# Patient Record
Sex: Female | Born: 1995 | Race: White | Marital: Single | Smoking: Never smoker
Health system: Southern US, Community
[De-identification: ages and names within clinical notes are randomized; demographics above are authoritative.]

## PROBLEM LIST (undated history)

## (undated) DIAGNOSIS — Z789 Other specified health status: Secondary | ICD-10-CM

## (undated) HISTORY — PX: TONSILLECTOMY: SUR1361

---

## 2015-12-03 ENCOUNTER — Inpatient Hospital Stay
Admission: EM | Admit: 2015-12-03 | Discharge: 2015-12-07 | DRG: 418 | Disposition: A | Discharge status: Home / Self Care | Payer: BLUE CROSS/BLUE SHIELD | Attending: Internal Medicine | Admitting: Internal Medicine

## 2015-12-03 ENCOUNTER — Emergency Department: Payer: BLUE CROSS/BLUE SHIELD

## 2015-12-03 ENCOUNTER — Encounter: Payer: Self-pay | Admitting: Emergency Medicine

## 2015-12-03 DIAGNOSIS — E876 Hypokalemia: Secondary | ICD-10-CM | POA: Diagnosis present

## 2015-12-03 DIAGNOSIS — K838 Other specified diseases of biliary tract: Secondary | ICD-10-CM

## 2015-12-03 DIAGNOSIS — K851 Biliary acute pancreatitis without necrosis or infection: Principal | ICD-10-CM | POA: Diagnosis present

## 2015-12-03 DIAGNOSIS — I959 Hypotension, unspecified: Secondary | ICD-10-CM | POA: Diagnosis present

## 2015-12-03 DIAGNOSIS — K8042 Calculus of bile duct with acute cholecystitis without obstruction: Secondary | ICD-10-CM | POA: Diagnosis not present

## 2015-12-03 DIAGNOSIS — R52 Pain, unspecified: Secondary | ICD-10-CM

## 2015-12-03 DIAGNOSIS — K8000 Calculus of gallbladder with acute cholecystitis without obstruction: Secondary | ICD-10-CM | POA: Diagnosis present

## 2015-12-03 DIAGNOSIS — K8020 Calculus of gallbladder without cholecystitis without obstruction: Secondary | ICD-10-CM

## 2015-12-03 DIAGNOSIS — R1013 Epigastric pain: Secondary | ICD-10-CM | POA: Diagnosis present

## 2015-12-03 DIAGNOSIS — R1011 Right upper quadrant pain: Secondary | ICD-10-CM

## 2015-12-03 HISTORY — DX: Other specified health status: Z78.9

## 2015-12-03 LAB — COMPREHENSIVE METABOLIC PANEL
ALBUMIN: 4.2 g/dL (ref 3.5–5.0)
ALK PHOS: 194 U/L — AB (ref 38–126)
ALT: 253 U/L — ABNORMAL HIGH (ref 14–54)
ANION GAP: 13 (ref 5–15)
AST: 280 U/L — ABNORMAL HIGH (ref 15–41)
BILIRUBIN TOTAL: 2.7 mg/dL — AB (ref 0.3–1.2)
BUN: 7 mg/dL (ref 6–20)
CALCIUM: 9.4 mg/dL (ref 8.9–10.3)
CO2: 19 mmol/L — ABNORMAL LOW (ref 22–32)
Chloride: 103 mmol/L (ref 101–111)
Creatinine, Ser: 0.9 mg/dL (ref 0.44–1.00)
GFR calc Af Amer: >60 mL/min (ref >60)
GLUCOSE: 107 mg/dL — AB (ref 65–99)
Potassium: 3.4 mmol/L — ABNORMAL LOW (ref 3.5–5.1)
Sodium: 135 mmol/L (ref 135–145)
TOTAL PROTEIN: 8.1 g/dL (ref 6.5–8.1)

## 2015-12-03 LAB — URINALYSIS COMPLETE WITH MICROSCOPIC (ARMC ONLY)
BILIRUBIN URINE: NEGATIVE
GLUCOSE, UA: NEGATIVE mg/dL
Hgb urine dipstick: NEGATIVE
LEUKOCYTES UA: NEGATIVE
NITRITE: NEGATIVE
Protein, ur: NEGATIVE mg/dL
SPECIFIC GRAVITY, URINE: 1.004 — AB (ref 1.005–1.030)
pH: 5 (ref 5.0–8.0)

## 2015-12-03 LAB — CBC
HCT: 45 % (ref 35.0–47.0)
Hemoglobin: 14.8 g/dL (ref 12.0–16.0)
MCH: 26 pg (ref 26.0–34.0)
MCHC: 32.9 g/dL (ref 32.0–36.0)
MCV: 79.1 fL — ABNORMAL LOW (ref 80.0–100.0)
Platelets: 294 10*3/uL (ref 150–440)
RBC: 5.7 MIL/uL — ABNORMAL HIGH (ref 3.80–5.20)
RDW: 14.3 % (ref 11.5–14.5)
WBC: 10.5 10*3/uL (ref 3.6–11.0)

## 2015-12-03 LAB — LIPASE, BLOOD: Lipase: 4182 U/L — ABNORMAL HIGH (ref 11–51)

## 2015-12-03 LAB — PREGNANCY, URINE: Preg Test, Ur: NEGATIVE

## 2015-12-03 MED ORDER — ONDANSETRON HCL 4 MG/2ML IJ SOLN
INTRAMUSCULAR | Status: AC
  Filled 2015-12-03: qty 2

## 2015-12-03 MED ORDER — MORPHINE SULFATE (PF) 4 MG/ML IV SOLN
INTRAVENOUS | Status: AC
  Filled 2015-12-03: qty 1

## 2015-12-03 MED ORDER — SODIUM CHLORIDE 0.9 % IV BOLUS (SEPSIS)
500.0000 mL | Freq: Once | INTRAVENOUS | Status: AC
Start: 2015-12-03 — End: 2015-12-03
  Administered 2015-12-03: 500 mL via INTRAVENOUS

## 2015-12-03 MED ORDER — MORPHINE SULFATE (PF) 4 MG/ML IV SOLN
4.0000 mg | Freq: Once | INTRAVENOUS | Status: AC
  Administered 2015-12-03: 4 mg via INTRAVENOUS

## 2015-12-03 MED ORDER — HYDROMORPHONE HCL 1 MG/ML IJ SOLN
1.0000 mg | Freq: Once | INTRAMUSCULAR | Status: AC
Start: 2015-12-03 — End: 2015-12-04
  Administered 2015-12-03: 1 mg via INTRAVENOUS

## 2015-12-03 MED ORDER — PIPERACILLIN-TAZOBACTAM 3.375 G IVPB 30 MIN
3.3750 g | Freq: Once | INTRAVENOUS | Status: AC
  Administered 2015-12-03: 3.375 g via INTRAVENOUS
  Filled 2015-12-03: qty 50

## 2015-12-03 MED ORDER — HYDROMORPHONE HCL 1 MG/ML IJ SOLN
INTRAMUSCULAR | Status: AC
  Filled 2015-12-03: qty 1

## 2015-12-03 MED ORDER — ONDANSETRON HCL 4 MG/2ML IJ SOLN
4.0000 mg | Freq: Once | INTRAMUSCULAR | Status: AC
  Administered 2015-12-03: 4 mg via INTRAVENOUS

## 2015-12-03 NOTE — H&P (Signed)
[facility] [place] at [facility]   PATIENT NAME: Sandra Archer    MR#:  [id number]  DATE OF BIRTH:  19-Mar-1995  DATE OF ADMISSION:  12/03/2015  PRIMARY CARE PHYSICIAN: Karena Addison, CNM   REQUESTING/REFERRING PHYSICIAN: Inocencio Homes, MD  CHIEF COMPLAINT:   Chief Complaint  Patient presents with  . Cholelithiasis    HISTORY OF PRESENT ILLNESS:  Sandra Archer  is a 20 y.o. female who presents with A couple of weeks of increasing abdominal discomfort. Initially was intermittent and the patient thought it is potentially due to acid reflux. She was taking some medication for the same. Then she went to the ED in [place] for evaluation and was found to have gallstones in her gallbladder, but was not found to be passing any stones at that time. She was scheduled for outpatient evaluation for elective surgery. However, over the past week her symptoms became more intense and more prolonged. Today she had a meal and had significant exacerbation of pain afterwards in the right upper quadrant and epigastric area. She came to the ED for evaluation and was found to have a stone in her common bile duct. Lipase also significantly elevated. Hospitalists were called for admission  PAST MEDICAL HISTORY:   Past Medical History:  Diagnosis Date  . Patient denies medical problems     PAST SURGICAL HISTORY:   Past Surgical History:  Procedure Laterality Date  . TONSILLECTOMY      SOCIAL HISTORY:   Social History  Substance Use Topics  . Smoking status: Never Smoker  . Smokeless tobacco: Never Used  . Alcohol use Yes    FAMILY HISTORY:   Family History  Problem Relation Age of Onset  . Gallstones Mother   . CAD Paternal Grandfather     DRUG ALLERGIES:   Allergies  Allergen Reactions  . Coconut Oil Swelling    MEDICATIONS AT HOME:   Prior to Admission medications   Not on File    REVIEW OF SYSTEMS:  Review of Systems  Constitutional: Negative  for chills, fever, malaise/fatigue and weight loss.  HENT: Negative for ear pain, hearing loss and tinnitus.   Eyes: Negative for blurred vision, double vision, pain and redness.  Respiratory: Negative for cough, hemoptysis and shortness of breath.   Cardiovascular: Negative for chest pain, palpitations, orthopnea and leg swelling.  Gastrointestinal: Positive for abdominal pain, nausea and vomiting. Negative for constipation and diarrhea.  Genitourinary: Negative for dysuria, frequency and hematuria.  Musculoskeletal: Negative for back pain, joint pain and neck pain.  Skin:       No acne, rash, or lesions  Neurological: Negative for dizziness, tremors, focal weakness and weakness.  Endo/Heme/Allergies: Negative for polydipsia. Does not bruise/bleed easily.  Psychiatric/Behavioral: Negative for depression. The patient is not nervous/anxious and does not have insomnia.      VITAL SIGNS:   Vitals:   12/03/15 1721 12/03/15 1722 12/03/15 2146  BP: 119/76  123/73  Pulse: 62  (!) 56  Resp: 18  18  Temp: 97.6 F (36.4 C)  98 F (36.7 C)  TempSrc: Oral  Oral  SpO2: 100%  98%  Weight:  117.9 kg (260 lb)   Height:  5\' 5"  (1.651 m)    Wt Readings from Last 3 Encounters:  12/03/15 117.9 kg (260 lb)    PHYSICAL EXAMINATION:  Physical Exam  Vitals reviewed. Constitutional: She is oriented to person, place, and time. She appears well-developed and well-nourished. No distress.  HENT:  Head: Normocephalic and atraumatic.  Mouth/Throat: Oropharynx is clear and moist.  Eyes: Conjunctivae and EOM are normal. Pupils are equal, round, and reactive to light. No scleral icterus.  Neck: Normal range of motion. Neck supple. No JVD present. No thyromegaly present.  Cardiovascular: Normal rate, regular rhythm and intact distal pulses.  Exam reveals no gallop and no friction rub.   No murmur heard. Respiratory: Effort normal and breath sounds normal. No respiratory distress. She has no wheezes. She  has no rales.  GI: Soft. Bowel sounds are normal. She exhibits no distension. There is tenderness (epigastric).  Musculoskeletal: Normal range of motion. She exhibits no edema.  No arthritis, no gout  Lymphadenopathy:    She has no cervical adenopathy.  Neurological: She is alert and oriented to person, place, and time. No cranial nerve deficit.  No dysarthria, no aphasia  Skin: Skin is warm and dry. No rash noted. No erythema.  Psychiatric: She has a normal mood and affect. Her behavior is normal. Judgment and thought content normal.    LABORATORY PANEL:   CBC  Recent Labs Lab 12/03/15 1730  WBC 10.5  HGB 14.8  HCT 45.0  PLT 294   ------------------------------------------------------------------------------------------------------------------  Chemistries   Recent Labs Lab 12/03/15 1730  NA 135  K 3.4*  CL 103  CO2 19*  GLUCOSE 107*  BUN 7  CREATININE 0.90  CALCIUM 9.4  AST 280*  ALT 253*  ALKPHOS 194*  BILITOT 2.7*   ------------------------------------------------------------------------------------------------------------------  Cardiac Enzymes No results for input(s): TROPONINI in the last 168 hours. ------------------------------------------------------------------------------------------------------------------  RADIOLOGY:  [place] Abdomen Limited Ruq  Result Date: 12/03/2015 CLINICAL DATA:  Initial evaluation for acute right upper quadrant and epigastric pain for 4 hours. Elevated LFTs with elevated lipase. EXAM: US ABDOMEN LIMITED - RIGHT UPPER QUADRANT COMPARISON:  None available. FINDINGS: Gallbladder: Multiple shadowing echogenic stones present within the gallbladder lumen, largest of which measured approximately 4.7 mm. Associated gallbladder sludge present. Gallbladder wall was thickened up to 7 mm. No frank pericholecystic free fluid. Unable to assess for sonographic Eulah PontMurphy sign as the patient was medicated. Common bile duct: Diameter: 10 mm. Possible  shadowing echogenic stone measuring approximately 6 mm noted within the distal common bile duct, suggestive of choledocholithiasis. Liver: No focal lesion identified. Within normal limits in parenchymal echogenicity. Mild intrahepatic biliary dilatation. IMPRESSION: 1. Cholelithiasis and sludge within the gallbladder lumen with associated gallbladder wall thickening. Given the provided history, findings are concerning for possible acute cholecystitis. 2. Dilatation of the common bile duct up to 10 mm. 6 mm echogenic focus within the distal common bile duct suggestive of an obstructive stone. Electronically Signed   By: Rise MuBenjamin  McClintock M.D.   On: 12/03/2015 20:48    EKG:  No orders found for this or any previous visit.  IMPRESSION AND PLAN:  Principal Problem:   Acute gallstone pancreatitis - when necessary pain meds and antiemetics tonight. We will get a GI consult and a surgery consult. Good possibility she'll need ERCP, though we will wait for GIs recommendations given her pancreatitis. Nothing by mouth for now with IV fluids.  All the records are reviewed and case discussed with ED provider. Management plans discussed with the patient and/or family.  DVT PROPHYLAXIS: SubQ lovenox  GI PROPHYLAXIS: None  ADMISSION STATUS: Inpatient  CODE STATUS: Full Code Status History    This patient does not have a recorded code status. Please follow your organizational policy for patients in this situation.      TOTAL TIME  TAKING CARE OF THIS PATIENT: 45 minutes.    Eamon Tantillo FIELDING 12/03/2015, 11:08 PM  [employer] [facility]  Office  [phone]  CC: Primary care physician; Karena Addison, CNM

## 2015-12-03 NOTE — ED Provider Notes (Signed)
[facility] Emergency Department Provider Note   ____________________________________________   First MD Initiated Contact with Patient 12/03/15 1732     (approximate)  I have reviewed the triage vital signs and the nursing notes.   HISTORY  Chief Complaint Cholelithiasis    HPI Sandra Archer is a 20 y.o. female with recent diagnosis of gallstones who presents for evaluation of 2 days of worsening epigastric and right upper quadrant pain which feels like "gallbladder attacks", gradual onset, constant, severe, no modifying factors. Patient was seen at an outside hospital ER in [place] approximately 2 weeks ago and diagnosed with gallstones, since then she has had pain related to the stones however pain became more severe yesterday. She was not discharged from the emergency department with any medications for pain or nausea. She has had nonbloody nonbilious emesis today. No fevers, no diarrhea. No chest pain or difficulty breathing. She is scheduled to establish care with Dr. Renda RollsWilton Smith on 12/13/2015.   History reviewed. No pertinent past medical history.  There are no active problems to display for this patient.   Past Surgical History:  Procedure Laterality Date  . TONSILLECTOMY      Prior to Admission medications   Not on File    Allergies Coconut oil  History reviewed. No pertinent family history.  Social History Social History  Substance Use Topics  . Smoking status: Never Smoker  . Smokeless tobacco: Never Used  . Alcohol use Yes    Review of Systems Constitutional: No fever/chills Eyes: No visual changes. ENT: No sore throat. Cardiovascular: Denies chest pain. Respiratory: Denies shortness of breath. Gastrointestinal: + abdominal pain.  + nausea, + vomiting.  No diarrhea.  No constipation. Genitourinary: Negative for dysuria. Musculoskeletal: Negative for back pain. Skin: Negative for rash. Neurological: Negative for  headaches, focal weakness or numbness.  10-point ROS otherwise negative.  ____________________________________________   PHYSICAL EXAM:  VITAL SIGNS: ED Triage Vitals  Enc Vitals Group     BP 12/03/15 1721 119/76     Pulse Rate 12/03/15 1721 62     Resp 12/03/15 1721 18     Temp 12/03/15 1721 97.6 F (36.4 C)     Temp Source 12/03/15 1721 Oral     SpO2 12/03/15 1721 100 %     Weight 12/03/15 1722 260 lb (117.9 kg)     Height 12/03/15 1722 5\' 5"  (1.651 m)     Head Circumference --      Peak Flow --      Pain Score 12/03/15 1723 8     Pain Loc --      Pain Edu? --      Excl. in GC? --     Constitutional: Alert and oriented. Well appearing and in no acute distress. Eyes: Conjunctivae are normal. PERRL. EOMI. Head: Atraumatic. Nose: No congestion/rhinnorhea. Mouth/Throat: Mucous membranes are moist.  Oropharynx non-erythematous. Neck: No stridor.  supple without meningismus. Cardiovascular: Normal rate, regular rhythm. Grossly normal heart sounds.  Good peripheral circulation. Respiratory: Normal respiratory effort.  No retractions. Lungs CTAB. Gastrointestinal: Soft with tenderness to palpation in the epigastrium and the right upper quadrant. No CVA tenderness. Genitourinary: deferred Musculoskeletal: No lower extremity tenderness nor edema.  No joint effusions. Neurologic:  Normal speech and language. No gross focal neurologic deficits are appreciated. No gait instability. Skin:  Skin is warm, dry and intact. No rash noted. Psychiatric: Mood and affect are normal. Speech and behavior are normal.  ____________________________________________   LABS (all labs  ordered are listed, but only abnormal results are displayed)  Labs Reviewed  LIPASE, BLOOD - Abnormal; Notable for the following:       Result Value   Lipase 4,182 (*)    All other components within normal limits  COMPREHENSIVE METABOLIC PANEL - Abnormal; Notable for the following:    Potassium 3.4 (*)    CO2  19 (*)    Glucose, Bld 107 (*)    AST 280 (*)    ALT 253 (*)    Alkaline Phosphatase 194 (*)    Total Bilirubin 2.7 (*)    All other components within normal limits  CBC - Abnormal; Notable for the following:    RBC 5.70 (*)    MCV 79.1 (*)    All other components within normal limits  URINALYSIS COMPLETEWITH MICROSCOPIC ([facility] ONLY) - Abnormal; Notable for the following:    Color, Urine YELLOW (*)    APPearance CLEAR (*)    Ketones, ur TRACE (*)    Specific Gravity, Urine 1.004 (*)    Bacteria, UA RARE (*)    Squamous Epithelial / LPF 0-5 (*)    All other components within normal limits  PREGNANCY, URINE   ____________________________________________  EKG  none ____________________________________________  RADIOLOGY  RUQ ultrasound IMPRESSION:  1. Cholelithiasis and sludge within the gallbladder lumen with  associated gallbladder wall thickening. Given the provided history,  findings are concerning for possible acute cholecystitis.  2. Dilatation of the common bile duct up to 10 mm. 6 mm echogenic  focus within the distal common bile duct suggestive of an  obstructive stone.    ____________________________________________   PROCEDURES  Procedure(s) performed: None  Procedures  Critical Care performed: No  ____________________________________________   INITIAL IMPRESSION / ASSESSMENT AND PLAN / ED COURSE  Pertinent labs & imaging results that were available during my care of the patient were reviewed by me and considered in my medical decision making (see chart for details).  Sandra Archer is a 20 y.o. female with recent diagnosis of gallstones who presents for evaluation of 2 days of worsening epigastric and right upper quadrant pain which feels like "gallbladder attacks". On exam, she is well-appearing and in no acute distress. Vital signs stable and she is afebrile. She does have tenderness to palpation in the epigastrium and the right upper  quadrant. Plan for abdominal labs, urinalysis, urine pregnancy test as well as right upper quadrant ultrasound, we'll treat her with anti-emetics, pain control and IV fluids.  ----------------------------------------- 9:14 PM on 12/03/2015 ----------------------------------------- Patient with significant improvement of her pain. CBC generally unremarkable. Urinalysis is not consistent with infection. Negative urine pregnancy test. CMP shows elevations of the LFTs as well as T bili, lipase is significantly elevated at greater than 4000 and right upper quadrant ultrasound confirms dilation of the common bile duct at 10 mm and concern for distal common bile duct stone. Ultrasound also shows thickening of the gallbladder wall without associated pericholecystic fluid which may represent an early acute cholecystitis, I have ordered Zosyn. I discussed the case with Dr. Excell Seltzer of Gen. surgery who recommended hospitalist admission with GI consult. We do have GI coverage tomorrow. Case discussed with hospitalist for admission.   Clinical Course     ____________________________________________   FINAL CLINICAL IMPRESSION(S) / ED DIAGNOSES  Final diagnoses:  Right upper quadrant abdominal pain  Pain  Choledocholithiasis with acute cholecystitis  Acute gallstone pancreatitis      NEW MEDICATIONS STARTED DURING THIS VISIT:  New Prescriptions  No medications on file     Note:  This document was prepared using Dragon voice recognition software and may include unintentional dictation errors.    Gayla Doss, MD 12/03/15 2139

## 2015-12-03 NOTE — ED Triage Notes (Signed)
Pt presents to ED with mother c/o gallstone attacks. Pt presents with known gallstones ; pain has worsened the last few weeks.

## 2015-12-03 NOTE — ED Notes (Signed)
Patient transported to Ultrasound 

## 2015-12-04 DIAGNOSIS — K8042 Calculus of bile duct with acute cholecystitis without obstruction: Secondary | ICD-10-CM

## 2015-12-04 DIAGNOSIS — K851 Biliary acute pancreatitis without necrosis or infection: Principal | ICD-10-CM

## 2015-12-04 LAB — BASIC METABOLIC PANEL
ANION GAP: 8 (ref 5–15)
BUN: 8 mg/dL (ref 6–20)
CALCIUM: 8.7 mg/dL — AB (ref 8.9–10.3)
CO2: 21 mmol/L — AB (ref 22–32)
CREATININE: 0.91 mg/dL (ref 0.44–1.00)
Chloride: 108 mmol/L (ref 101–111)
GFR calc Af Amer: >60 mL/min (ref >60)
GLUCOSE: 81 mg/dL (ref 65–99)
Potassium: 3.9 mmol/L (ref 3.5–5.1)
Sodium: 137 mmol/L (ref 135–145)

## 2015-12-04 LAB — LIPASE, BLOOD
LIPASE: 2354 U/L — AB (ref 11–51)
LIPASE: 1975 U/L — AB (ref 11–51)

## 2015-12-04 LAB — CBC
HCT: 40.7 % (ref 35.0–47.0)
Hemoglobin: 13.3 g/dL (ref 12.0–16.0)
MCH: 25.8 pg — AB (ref 26.0–34.0)
MCHC: 32.8 g/dL (ref 32.0–36.0)
MCV: 78.8 fL — ABNORMAL LOW (ref 80.0–100.0)
PLATELETS: 248 10*3/uL (ref 150–440)
RBC: 5.16 MIL/uL (ref 3.80–5.20)
RDW: 14.5 % (ref 11.5–14.5)
WBC: 8.8 10*3/uL (ref 3.6–11.0)

## 2015-12-04 MED ORDER — SODIUM CHLORIDE 0.9 % IV SOLN
INTRAVENOUS | Status: DC
  Administered 2015-12-04 – 2015-12-07 (×10): via INTRAVENOUS

## 2015-12-04 MED ORDER — HYDROMORPHONE HCL 1 MG/ML IJ SOLN
0.5000 mg | INTRAMUSCULAR | Status: DC | PRN
  Administered 2015-12-07: 0.5 mg via INTRAVENOUS
  Filled 2015-12-04: qty 1

## 2015-12-04 MED ORDER — OXYCODONE HCL 5 MG PO TABS: 5.0000 mg | ORAL_TABLET | ORAL | Status: DC | PRN

## 2015-12-04 MED ORDER — ONDANSETRON HCL 4 MG/2ML IJ SOLN: 4.0000 mg | Freq: Four times a day (QID) | INTRAMUSCULAR | Status: DC | PRN

## 2015-12-04 MED ORDER — ENOXAPARIN SODIUM 40 MG/0.4ML ~~LOC~~ SOLN
40.0000 mg | Freq: Two times a day (BID) | SUBCUTANEOUS | Status: DC
  Administered 2015-12-04 – 2015-12-06 (×5): 40 mg via SUBCUTANEOUS
  Filled 2015-12-04 (×5): qty 0.4

## 2015-12-04 MED ORDER — ONDANSETRON HCL 4 MG PO TABS: 4.0000 mg | ORAL_TABLET | Freq: Four times a day (QID) | ORAL | Status: DC | PRN

## 2015-12-04 NOTE — Consult Note (Signed)
Midge Minium, MD [facility]  [address] [place], [place] [address] Phone: [phone] Fax : [phone]  Consultation  Referring Provider:     No ref. provider found Primary Care Physician:  Karena Addison, CNM Primary Gastroenterologist:  None         Reason for Consultation:     Gallstone pancreatitis  Date of Admission:  12/03/2015 Date of Consultation:  12/04/2015         HPI:   Sandra Archer is a 20 y.o. female Who reports a couple of weeks of  Abdominal pain.  The patient  Was found to have gallstones in her gallbladder in the emergency department in [place].  She was discharged home and  Told to follow up as an outpatient.  The patient was then admitted with significant exacerbation of her pain in the right upper quadrant and epigastric area.  The patient underwent an ultrasound and was found to have sludge in the gallbladder and a dilated common bile duct with a stone in the distal common bile duct.  The patient was also found to have an increased lipase that has come down but not  Near-normal yet.   The patient reports that her abdominal pain is better than when she was admitted.  I'm now being asked to see the patient for gallstone pancreatitis with a retained stone and for possible ERCP.  Past Medical History:  Diagnosis Date  . Patient denies medical problems     Past Surgical History:  Procedure Laterality Date  . TONSILLECTOMY      Prior to Admission medications   Not on File    Family History  Problem Relation Age of Onset  . Gallstones Mother   . CAD Paternal Grandfather      Social History  Substance Use Topics  . Smoking status: Never Smoker  . Smokeless tobacco: Never Used  . Alcohol use Yes    Allergies as of 12/03/2015 - Review Complete 12/03/2015  Allergen Reaction Noted  . Coconut oil Swelling 12/03/2015    Review of Systems:    All systems reviewed and negative except where noted in HPI.   Physical Exam:  Vital signs in  last 24 hours: Temp:  [97.7 F (36.5 C)-98.2 F (36.8 C)] 98 F (36.7 C) (10/09 2007) Pulse Rate:  [56-78] 78 (10/09 2007) Resp:  [18-22] 18 (10/09 2007) BP: (108-123)/(66-87) 122/69 (10/09 2007) SpO2:  [98 %-100 %] 100 % (10/09 2007) Weight:  [244 lb 6.4 oz (110.9 kg)] 244 lb 6.4 oz (110.9 kg) (10/09 0000) Last BM Date: 12/03/15 General:   Pleasant, cooperative in NAD Head:  Normocephalic and atraumatic. Eyes:   No icterus.   Conjunctiva pink. PERRLA. Ears:  Normal auditory acuity. Neck:  Supple; no masses or thyroidomegaly Lungs: Respirations even and unlabored. Lungs clear to auscultation bilaterally.   No wheezes, crackles, or rhonchi.  Heart:  Regular rate and rhythm;  Without murmur, clicks, rubs or gallops Abdomen:  Soft, nondistended, Epigastric tenderness. Normal bowel sounds. No appreciable masses or hepatomegaly.  No rebound or guarding.  Rectal:  Not performed. Msk:  Symmetrical without gross deformities.    Extremities:  Without edema, cyanosis or clubbing. Neurologic:  Alert and oriented x3;  grossly normal neurologically. Skin:  Intact without significant lesions or rashes. Cervical Nodes:  No significant cervical adenopathy. Psych:  Alert and cooperative. Normal affect.  LAB RESULTS:  Recent Labs  12/03/15 1730 12/04/15 0552  WBC 10.5 8.8  HGB 14.8 13.3  HCT 45.0 40.7  PLT 294 248   BMET  Recent Labs  12/03/15 1730 12/04/15 0552  NA 135 137  K 3.4* 3.9  CL 103 108  CO2 19* 21*  GLUCOSE 107* 81  BUN 7 8  CREATININE 0.90 0.91  CALCIUM 9.4 8.7*   LFT  Recent Labs  12/03/15 1730  PROT 8.1  ALBUMIN 4.2  AST 280*  ALT 253*  ALKPHOS 194*  BILITOT 2.7*   PT/INR No results for input(s): LABPROT, INR in the last 72 hours.  STUDIES: [place] Abdomen Limited Ruq  Result Date: 12/03/2015 CLINICAL DATA:  Initial evaluation for acute right upper quadrant and epigastric pain for 4 hours. Elevated LFTs with elevated lipase. EXAM: US ABDOMEN LIMITED -  RIGHT UPPER QUADRANT COMPARISON:  None available. FINDINGS: Gallbladder: Multiple shadowing echogenic stones present within the gallbladder lumen, largest of which measured approximately 4.7 mm. Associated gallbladder sludge present. Gallbladder wall was thickened up to 7 mm. No frank pericholecystic free fluid. Unable to assess for sonographic Eulah PontMurphy sign as the patient was medicated. Common bile duct: Diameter: 10 mm. Possible shadowing echogenic stone measuring approximately 6 mm noted within the distal common bile duct, suggestive of choledocholithiasis. Liver: No focal lesion identified. Within normal limits in parenchymal echogenicity. Mild intrahepatic biliary dilatation. IMPRESSION: 1. Cholelithiasis and sludge within the gallbladder lumen with associated gallbladder wall thickening. Given the provided history, findings are concerning for possible acute cholecystitis. 2. Dilatation of the common bile duct up to 10 mm. 6 mm echogenic focus within the distal common bile duct suggestive of an obstructive stone. Electronically Signed   By: Rise MuBenjamin  McClintock M.D.   On: 12/03/2015 20:48      Impression / Plan:   Sandra Archer is a 20 y.o. y/o female with Gallstone pancreatitis and a retained stone in the common bile duct.  The patient's lipase has come down but not as rapidly as would be expected.  The patient will be kept nothing by mouth and she will undergo an ERCP tomorrow morning.  The patient and her mother have been explained the plan and the risks including perforation, bleeding worsening of pancreatitis and death.  The patient and her mother agree to proceed with the procedure.  Thank you for involving me in the care of this patient.      LOS: 1 day   Midge Miniumarren Avantika Shere, MD  12/04/2015, 9:08 PM   Note: This dictation was prepared with Dragon dictation along with smaller phrase technology. Any transcriptional errors that result from this process are unintentional.

## 2015-12-04 NOTE — Progress Notes (Signed)
[facility] - [place] at [facility]   PATIENT NAME: Sandra Archer    MR#:  [id number]  DATE OF BIRTH:  06/17/1995  SUBJECTIVE:  CHIEF COMPLAINT:   Chief Complaint  Patient presents with  . Cholelithiasis  She is still in pain but controlled with pain medication, parents at bedside REVIEW OF SYSTEMS:  Review of Systems  Constitutional: Negative for chills, fever and weight loss.  HENT: Negative for nosebleeds and sore throat.   Eyes: Negative for blurred vision.  Respiratory: Negative for cough, shortness of breath and wheezing.   Cardiovascular: Negative for chest pain, orthopnea, leg swelling and PND.  Gastrointestinal: Positive for abdominal pain and nausea. Negative for constipation, diarrhea, heartburn and vomiting.  Genitourinary: Negative for dysuria and urgency.  Musculoskeletal: Negative for back pain.  Skin: Negative for rash.  Neurological: Negative for dizziness, speech change, focal weakness and headaches.  Endo/Heme/Allergies: Does not bruise/bleed easily.  Psychiatric/Behavioral: Negative for depression.   DRUG ALLERGIES:   Allergies  Allergen Reactions  . Coconut Oil Swelling   VITALS:  Blood pressure 108/73, pulse 73, temperature 98.2 F (36.8 C), temperature source Oral, resp. rate 20, height 5\' 5"  (1.651 m), weight 110.9 kg (244 lb 6.4 oz), last menstrual period 11/12/2015, SpO2 98 %. PHYSICAL EXAMINATION:  Physical Exam  Constitutional: She is oriented to person, place, and time and well-developed, well-nourished, and in no distress.  HENT:  Head: Normocephalic and atraumatic.  Eyes: Conjunctivae and EOM are normal. Pupils are equal, round, and reactive to light.  Neck: Normal range of motion. Neck supple. No tracheal deviation present. No thyromegaly present.  Cardiovascular: Normal rate, regular rhythm and normal heart sounds.   Pulmonary/Chest: Effort normal and breath sounds normal. No respiratory distress. She has no wheezes. She  exhibits no tenderness.  Abdominal: Soft. Bowel sounds are normal. She exhibits no distension.  Musculoskeletal: Normal range of motion.  Neurological: She is alert and oriented to person, place, and time. No cranial nerve deficit.  Skin: Skin is warm and dry. No rash noted.  Psychiatric: Mood and affect normal.   LABORATORY PANEL:   CBC  Recent Labs Lab 12/04/15 0552  WBC 8.8  HGB 13.3  HCT 40.7  PLT 248   ------------------------------------------------------------------------------------------------------------------ Chemistries   Recent Labs Lab 12/03/15 1730 12/04/15 0552  NA 135 137  K 3.4* 3.9  CL 103 108  CO2 19* 21*  GLUCOSE 107* 81  BUN 7 8  CREATININE 0.90 0.91  CALCIUM 9.4 8.7*  AST 280*  --   ALT 253*  --   ALKPHOS 194*  --   BILITOT 2.7*  --    RADIOLOGY:  [place] Abdomen Limited Ruq  Result Date: 12/03/2015 CLINICAL DATA:  Initial evaluation for acute right upper quadrant and epigastric pain for 4 hours. Elevated LFTs with elevated lipase. EXAM: US ABDOMEN LIMITED - RIGHT UPPER QUADRANT COMPARISON:  None available. FINDINGS: Gallbladder: Multiple shadowing echogenic stones present within the gallbladder lumen, largest of which measured approximately 4.7 mm. Associated gallbladder sludge present. Gallbladder wall was thickened up to 7 mm. No frank pericholecystic free fluid. Unable to assess for sonographic Eulah PontMurphy sign as the patient was medicated. Common bile duct: Diameter: 10 mm. Possible shadowing echogenic stone measuring approximately 6 mm noted within the distal common bile duct, suggestive of choledocholithiasis. Liver: No focal lesion identified. Within normal limits in parenchymal echogenicity. Mild intrahepatic biliary dilatation. IMPRESSION: 1. Cholelithiasis and sludge within the gallbladder lumen with associated gallbladder wall thickening. Given the provided  history, findings are concerning for possible acute cholecystitis. 2. Dilatation of the common  bile duct up to 10 mm. 6 mm echogenic focus within the distal common bile duct suggestive of an obstructive stone. Electronically Signed   By: Rise Mu M.D.   On: 12/03/2015 20:48   ASSESSMENT AND PLAN:  20 year old female being admitted for acute gall stone pancreatitis  * Acute gallstone pancreatitis   - Continue when necessary pain meds and antiemetics  - Pending GI and a surgery consult. Good possibility she'll need ERCP, though we will await for GIs recommendations given her pancreatitis.  - Nothing by mouth for now with IV fluids.  * Acute cholecystitis with cholelithiasis - We will likely need cholecystectomy later  * Hypokalemia - Repleted and resolved  * Hypotension - Monitor while on IV fluids    All the records are reviewed and case discussed with Care Management/Social Worker. Management plans discussed with the patient, nursing family (parents at bedside) and they are in agreement.  CODE STATUS: FULL CODE  TOTAL TIME TAKING CARE OF THIS PATIENT: 35 minutes.   More than 50% of the time was spent in counseling/coordination of care: YES  POSSIBLE D/C IN 1-2 DAYS, DEPENDING ON CLINICAL CONDITION.  And GI - surgical evaluation   [facility], Reshard Guillet M.D on 12/04/2015 at 1:15 PM  Between 7am to 6pm - Pager - [phone]  After 6pm go to [email or web address] - Social research officer, government  [facility] [place] [facility]  Office  [phone]  CC: Primary care physician; Karena Addison, CNM  Note: This dictation was prepared with Dragon dictation along with smaller phrase technology. Any transcriptional errors that result from this process are unintentional.

## 2015-12-04 NOTE — Consult Note (Signed)
Patient ID: Sandra MayhewMartha K Loadholt, female   DOB: 06/08/1995, 20 y.o.   MRN: [id number]  HPI Sandra Archer is a 20 y.o. female seen in consultation for gallstone pancreatitis. She reports that she has been having symptoms for the last 4-5 months with intermittent right upper quadrant sharp and pulsating pain. Pain was moderate in intensity and was exacerbated by heavy meals. About 2 weeks ago and [facility] and in the emergency room and workup revealed evidence of gallstones without any other major abnormalities. Since then her symptoms have progressively worsened. Over the last week she has severe right upper quadrant and epigastric pain that is pulsating and I worsening with movements. At the [facility] but the narcotics she has been administered well she is in hospital. And she also reports some decreased appetite and nausea. No evidence of biliary obstruction. No evidence of jaundice, no fevers, no chills. He has good cardiovascular reserve and is able to perform more than 4 Mets without any shortness of breath or chest pain. No significant abdominal surgical history. She only had a history of tonsillectomy in the past. I have personally seen and review all the imaging studies. Ultrasound shows evidence of thickening gallbladder wall with a dilated common bile duct To 10 mm with a common bile duct stone. There is also evidence of stone within the gallbladder. I have also reviewed her laboratory data with an initial lipase of greater than 4000 and also increase in alkaline phosphatase and total bilirubin 2.8. Her [place] now [place] [address].  HPI  Past Medical History:  Diagnosis Date  . Patient denies medical problems     Past Surgical History:  Procedure Laterality Date  . TONSILLECTOMY      Family History  Problem Relation Age of Onset  . Gallstones Mother   . CAD Paternal Grandfather     Social History Social History   Substance Use Topics  . Smoking status: Never Smoker  . Smokeless tobacco: Never Used  . Alcohol use Yes    Allergies  Allergen Reactions  . Coconut Oil Swelling    Current Facility-Administered Medications  Medication Dose Route Frequency Provider Last Rate Last Dose  . 0.9 %  sodium chloride infusion   Intravenous Continuous Oralia Manisavid Willis, MD 100 mL/hr at 12/04/15 1720    . enoxaparin (LOVENOX) injection 40 mg  40 mg Subcutaneous BID Oralia Manisavid Willis, MD   40 mg at 12/04/15 0942  . HYDROmorphone (DILAUDID) injection 0.5 mg  0.5 mg Intravenous Q4H PRN Oralia Manisavid Willis, MD      . ondansetron [facility](ZOFRAN) tablet 4 mg  4 mg Oral Q6H PRN Oralia Manisavid Willis, MD       Or  . ondansetron [facility](ZOFRAN) injection 4 mg  4 mg Intravenous Q6H PRN Oralia Manisavid Willis, MD      . oxyCODONE (Oxy IR/ROXICODONE) immediate release tablet 5 mg  5 mg Oral Q4H PRN Oralia Manisavid Willis, MD         Review of Systems A 10 point review of systems was asked and was negative except for the information on the HPI  Physical Exam Blood pressure 122/69, pulse 78, temperature 98 F (36.7 C), temperature source Oral, resp. rate 18, height 5\' 5"  (1.651 m), weight 110.9 kg (244 lb 6.4 oz), last menstrual period 11/12/2015, SpO2 100 %. CONSTITUTIONAL: NAD, overweight EYES: Pupils are equal, round, and reactive to light, Sclera are non-icteric. EARS, NOSE, MOUTH AND THROAT: The  oropharynx is clear. The oral mucosa is pink and moist. Hearing is intact to voice. LYMPH NODES:  Lymph nodes in the neck are normal. RESPIRATORY:  Lungs are clear. There is normal respiratory effort, with equal breath sounds bilaterally, and without pathologic use of accessory muscles. CARDIOVASCULAR: Heart is regular without murmurs, gallops, or rubs. GI: The abdomen is soft, mildly tender to palpation in the right upper quadrant and epigastric area. No peritonitis no evidence of Murphy sign. GU: Rectal deferred.   MUSCULOSKELETAL: Normal muscle strength and tone. No cyanosis  or edema.   SKIN: Turgor is good and there are no pathologic skin lesions or ulcers. NEUROLOGIC: Motor and sensation is grossly normal. Cranial nerves are grossly intact. PSYCH:  Oriented to person, place and time. Affect is normal.  Data Reviewed  I have personally reviewed the patient's imaging, laboratory findings and medical records.    Assessment/Plan Evidence of gallstone pancreatitis and biliary obstruction is evidence on ultrasound with biliary dilation and dilated common bile duct stone with increase in total bilirubin. I have discussed in great detail with Dr.Wohl will perform an ERCP with a sphincterotomy in the morning. I will like to wait at least 24 hours to make sure that she recovers from her ERCP before proceeding with a laparoscopic cholecystectomy. We tentatively should be able to do her gallbladder on Thursday. Discussed with the patient in detail about her disease process.I discussed the procedure in detail.  The patient was given Agricultural engineer.  We discussed the risks and benefits of a laparoscopic cholecystectomy and possible cholangiogram including, but not limited to bleeding, infection, injury to surrounding structures such as the intestine or liver, bile leak, retained gallstones, need to convert to an open procedure, prolonged diarrhea, blood clots such as  DVT, common bile duct injury, anesthesia risks, and possible need for additional procedures.  The likelihood of improvement in symptoms and return to the patient's normal status is good. We discussed the typical post-operative recovery course. Dr. Tonita Cong will be likely performing the surgery and I will make sure that I will discuss the case in detail with him. All the questions were answered   Sterling Big, MD FACS General Surgeon 12/04/2015, 8:11 PM

## 2015-12-05 ENCOUNTER — Inpatient Hospital Stay: Payer: BLUE CROSS/BLUE SHIELD | Admitting: Anesthesiology

## 2015-12-05 ENCOUNTER — Encounter
Admission: EM | Disposition: A | Discharge status: Home / Self Care | Payer: Self-pay | Source: Home / Self Care | Attending: Internal Medicine

## 2015-12-05 DIAGNOSIS — K838 Other specified diseases of biliary tract: Secondary | ICD-10-CM

## 2015-12-05 HISTORY — PX: ENDOSCOPIC RETROGRADE CHOLANGIOPANCREATOGRAPHY (ERCP) WITH PROPOFOL: SHX5810

## 2015-12-05 LAB — COMPREHENSIVE METABOLIC PANEL
ALBUMIN: 3.5 g/dL (ref 3.5–5.0)
ALT: 158 U/L — ABNORMAL HIGH (ref 14–54)
AST: 102 U/L — AB (ref 15–41)
Alkaline Phosphatase: 140 U/L — ABNORMAL HIGH (ref 38–126)
Anion gap: 8 (ref 5–15)
BUN: 8 mg/dL (ref 6–20)
CHLORIDE: 109 mmol/L (ref 101–111)
CO2: 20 mmol/L — ABNORMAL LOW (ref 22–32)
Calcium: 8.6 mg/dL — ABNORMAL LOW (ref 8.9–10.3)
Creatinine, Ser: 0.83 mg/dL (ref 0.44–1.00)
GFR calc Af Amer: >60 mL/min (ref >60)
GFR calc non Af Amer: >60 mL/min (ref >60)
GLUCOSE: 75 mg/dL (ref 65–99)
POTASSIUM: 4.1 mmol/L (ref 3.5–5.1)
SODIUM: 137 mmol/L (ref 135–145)
Total Bilirubin: 0.8 mg/dL (ref 0.3–1.2)
Total Protein: 6.8 g/dL (ref 6.5–8.1)

## 2015-12-05 LAB — LIPASE, BLOOD
LIPASE: 1423 U/L — AB (ref 11–51)
LIPASE: 656 U/L — AB (ref 11–51)

## 2015-12-05 SURGERY — ENDOSCOPIC RETROGRADE CHOLANGIOPANCREATOGRAPHY (ERCP) WITH PROPOFOL
Anesthesia: General

## 2015-12-05 MED ORDER — LIDOCAINE HCL (CARDIAC) 20 MG/ML IV SOLN
INTRAVENOUS | Status: DC | PRN
  Administered 2015-12-05: 100 mg via INTRAVENOUS

## 2015-12-05 MED ORDER — MIDAZOLAM HCL 2 MG/2ML IJ SOLN
INTRAMUSCULAR | Status: DC | PRN
  Administered 2015-12-05: 2 mg via INTRAVENOUS

## 2015-12-05 MED ORDER — INDOMETHACIN 50 MG RE SUPP
100.0000 mg | Freq: Once | RECTAL | Status: AC
  Administered 2015-12-05: 100 mg via RECTAL
  Filled 2015-12-05: qty 2

## 2015-12-05 MED ORDER — FENTANYL CITRATE (PF) 100 MCG/2ML IJ SOLN
INTRAMUSCULAR | Status: DC | PRN
  Administered 2015-12-05: 100 ug via INTRAVENOUS

## 2015-12-05 MED ORDER — PROPOFOL 10 MG/ML IV BOLUS
INTRAVENOUS | Status: DC | PRN
  Administered 2015-12-05: 200 mg via INTRAVENOUS

## 2015-12-05 MED ORDER — ALFENTANIL 500 MCG/ML IJ INJ
INJECTION | INTRAMUSCULAR | Status: DC | PRN
  Administered 2015-12-05: 500 ug via INTRAVENOUS

## 2015-12-05 MED ORDER — ROCURONIUM BROMIDE 100 MG/10ML IV SOLN
INTRAVENOUS | Status: DC | PRN
  Administered 2015-12-05: 35 mg via INTRAVENOUS

## 2015-12-05 MED ORDER — ONDANSETRON HCL 4 MG/2ML IJ SOLN
INTRAMUSCULAR | Status: DC | PRN
  Administered 2015-12-05: 4 mg via INTRAVENOUS

## 2015-12-05 NOTE — Anesthesia Procedure Notes (Signed)
Procedure Name: Intubation Date/Time: 12/05/2015 1:32 PM Performed by: Shirlee LimerickMARION, Sandra Terpstra Pre-anesthesia Checklist: Patient identified, Emergency Drugs available, Suction available, Timeout performed and Patient being monitored Patient Re-evaluated:Patient Re-evaluated prior to inductionOxygen Delivery Method: Circle system utilized Preoxygenation: Pre-oxygenation with 100% oxygen Intubation Type: IV induction Laryngoscope Size: Mac and 3 Grade View: Grade I Tube size: 7.0 mm Number of attempts: 1 Placement Confirmation: ETT inserted through vocal cords under direct vision,  positive ETCO2 and breath sounds checked- equal and bilateral Secured at: 21 cm Tube secured with: Tape

## 2015-12-05 NOTE — Anesthesia Preprocedure Evaluation (Signed)
Anesthesia Evaluation  Patient identified by MRN, date of birth, ID band Patient awake    Reviewed: Allergy & Precautions, H&P , NPO status , Patient's Chart, lab work & pertinent test results, reviewed documented beta blocker date and time   Airway Mallampati: II   Neck ROM: full    Dental  (+) Teeth Intact   Pulmonary neg pulmonary ROS,    Pulmonary exam normal        Cardiovascular negative cardio ROS Normal cardiovascular exam Rhythm:regular Rate:Normal     Neuro/Psych negative neurological ROS  negative psych ROS   GI/Hepatic negative GI ROS, Neg liver ROS,   Endo/Other  negative endocrine ROS  Renal/GU negative Renal ROS  negative genitourinary   Musculoskeletal   Abdominal   Peds  Hematology negative hematology ROS (+)   Anesthesia Other Findings Past Medical History: No date: Patient denies medical problems Past Surgical History: No date: TONSILLECTOMY BMI    Body Mass Index:  40.67 kg/m     Reproductive/Obstetrics                             Anesthesia Physical Anesthesia Plan  ASA: II  Anesthesia Plan: General   Post-op Pain Management:    Induction: Intravenous  Airway Management Planned: Oral ETT  Additional Equipment:   Intra-op Plan:   Post-operative Plan:   Informed Consent: I have reviewed the patients History and Physical, chart, labs and discussed the procedure including the risks, benefits and alternatives for the proposed anesthesia with the patient or authorized representative who has indicated his/her understanding and acceptance.   Dental Advisory Given  Plan Discussed with: CRNA  Anesthesia Plan Comments:         Anesthesia Quick Evaluation

## 2015-12-05 NOTE — OR Nursing (Signed)
Patient extubated by anesthesia and taken to PACU for recovery.

## 2015-12-05 NOTE — Transfer of Care (Signed)
Immediate Anesthesia Transfer of Care Note  Patient: Sandra Archer  Procedure(s) Performed: Procedure(s): ENDOSCOPIC RETROGRADE CHOLANGIOPANCREATOGRAPHY (ERCP) WITH PROPOFOL (N/A)  Patient Location: PACU  Anesthesia Type:General  Level of Consciousness: awake, alert , oriented and patient cooperative  Airway & Oxygen Therapy: Patient Spontanous Breathing and Patient connected to nasal cannula oxygen  Post-op Assessment: Report given to RN and Post -op Vital signs reviewed and stable  Post vital signs: Reviewed and stable  Last Vitals:  Vitals:   12/05/15 0545 12/05/15 1159  BP: 114/73 132/76  Pulse: 90 79  Resp: 20 17  Temp: 36.6 C 37.1 C    Last Pain:  Vitals:   12/05/15 1159  TempSrc: Oral  PainSc:       Patients Stated Pain Goal: 0 (12/04/15 0000)  Complications: No apparent anesthesia complications

## 2015-12-05 NOTE — Progress Notes (Signed)
[facility] - [place] [facility]   PATIENT NAME: Sandra Archer    MR#:  [id number]  DATE OF BIRTH:  09/27/1995  SUBJECTIVE:  CHIEF COMPLAINT:   Chief Complaint  Patient presents with  . Cholelithiasis  S/p ERCP, pain under much better control REVIEW OF SYSTEMS:  Review of Systems  Constitutional: Negative for chills, fever and weight loss.  HENT: Negative for nosebleeds and sore throat.   Eyes: Negative for blurred vision.  Respiratory: Negative for cough, shortness of breath and wheezing.   Cardiovascular: Negative for chest pain, orthopnea, leg swelling and PND.  Gastrointestinal: Positive for abdominal pain and nausea. Negative for constipation, diarrhea, heartburn and vomiting.  Genitourinary: Negative for dysuria and urgency.  Musculoskeletal: Negative for back pain.  Skin: Negative for rash.  Neurological: Negative for dizziness, speech change, focal weakness and headaches.  Endo/Heme/Allergies: Does not bruise/bleed easily.  Psychiatric/Behavioral: Negative for depression.   DRUG ALLERGIES:   Allergies  Allergen Reactions  . Coconut Oil Swelling   VITALS:  Blood pressure 116/74, pulse 73, temperature 98 F (36.7 C), temperature source Oral, resp. rate 14, height 5\' 5"  (1.651 m), weight 110.9 kg (244 lb 6.4 oz), last menstrual period 11/12/2015, SpO2 100 %. PHYSICAL EXAMINATION:  Physical Exam  Constitutional: She is oriented to person, place, and time and well-developed, well-nourished, and in no distress.  HENT:  Head: Normocephalic and atraumatic.  Eyes: Conjunctivae and EOM are normal. Pupils are equal, round, and reactive to light.  Neck: Normal range of motion. Neck supple. No tracheal deviation present. No thyromegaly present.  Cardiovascular: Normal rate, regular rhythm and normal heart sounds.   Pulmonary/Chest: Effort normal and breath sounds normal. No respiratory distress. She has no wheezes. She exhibits no tenderness.  Abdominal:  Soft. Bowel sounds are normal. She exhibits no distension.  Musculoskeletal: Normal range of motion.  Neurological: She is alert and oriented to person, place, and time. No cranial nerve deficit.  Skin: Skin is warm and dry. No rash noted.  Psychiatric: Mood and affect normal.   LABORATORY PANEL:   CBC  Recent Labs Lab 12/04/15 0552  WBC 8.8  HGB 13.3  HCT 40.7  PLT 248   ------------------------------------------------------------------------------------------------------------------ Chemistries   Recent Labs Lab 12/05/15 0513  NA 137  K 4.1  CL 109  CO2 20*  GLUCOSE 75  BUN 8  CREATININE 0.83  CALCIUM 8.6*  AST 102*  ALT 158*  ALKPHOS 140*  BILITOT 0.8   RADIOLOGY:  No results found. ASSESSMENT AND PLAN:  20 year old female being admitted for acute gall stone pancreatitis  * Acute gallstone pancreatitis   - Continue when necessary pain meds and antiemetics  - s/p ERCP and biliary sphincterotomy - Biliary sludge was found.  * Acute cholecystitis with cholelithiasis - Lap cholecystectomy planned for Thursday  * Hypokalemia - Repleted and resolved  * Hypotension - Monitor while on IV fluids    All the records are reviewed and case discussed with Care Management/Social Worker. Management plans discussed with the patient, nursing family (parents at bedside) and they are in agreement.  CODE STATUS: FULL CODE  TOTAL TIME TAKING CARE OF THIS PATIENT: 35 minutes.   More than 50% of the time was spent in counseling/coordination of care: YES  POSSIBLE D/C IN 2-3 DAYS, DEPENDING ON CLINICAL CONDITION.  surgical evaluation, lap chole planned for thursday   [facility], Chany Woolworth M.D on 12/05/2015 at 4:18 PM  Between 7am to 6pm - Pager - [phone]  After 6pm  go to [email or web address] - Social research officer, government  [facility] [place] [facility]  Office  [phone]  CC: Primary care physician; Karena Addison, CNM  Note: This dictation was prepared with Dragon  dictation along with smaller phrase technology. Any transcriptional errors that result from this process are unintentional.

## 2015-12-05 NOTE — Anesthesia Postprocedure Evaluation (Signed)
Anesthesia Post Note  Patient: Sandra Archer  Procedure(s) Performed: Procedure(s) (LRB): ENDOSCOPIC RETROGRADE CHOLANGIOPANCREATOGRAPHY (ERCP) WITH PROPOFOL (N/A)  Patient location during evaluation: PACU Anesthesia Type: General Level of consciousness: awake and alert and oriented Pain management: pain level controlled Vital Signs Assessment: post-procedure vital signs reviewed and stable Respiratory status: spontaneous breathing Cardiovascular status: blood pressure returned to baseline Anesthetic complications: no    Last Vitals:  Vitals:   12/05/15 1440 12/05/15 1453  BP: 123/78 118/75  Pulse: 60 63  Resp: (!) 23 (!) 23  Temp:      Last Pain:  Vitals:   12/05/15 1159  TempSrc: Oral  PainSc:                  Sandra Archer

## 2015-12-05 NOTE — Op Note (Addendum)
[facility] Patient Name: Sandra Archer Procedure Date: 12/05/2015 1:18 PM MRN: [id number] Account #: [id number] Date of Birth: 02-16-1996 Admit Type: Outpatient Age: 20 Room: [facility] ENDO ROOM 4 Gender: Female Note Status: Finalized Procedure:            ERCP Indications:          Suspected bile duct stone(s) Providers:            Midge Minium MD, MD Referring MD:         No Local Md, MD (Referring MD) Medicines:            General Anesthesia Complications:        No immediate complications. Procedure:            Pre-Anesthesia Assessment:                       - Prior to the procedure, a History and Physical was                        performed, and patient medications and allergies were                        reviewed. The patient's tolerance of previous                        anesthesia was also reviewed. The risks and benefits of                        the procedure and the sedation options and risks were                        discussed with the patient. All questions were                        answered, and informed consent was obtained. Prior                        Anticoagulants: The patient has taken no previous                        anticoagulant or antiplatelet agents. ASA Grade                        Assessment: II - A patient with mild systemic disease.                        After reviewing the risks and benefits, the patient was                        deemed in satisfactory condition to undergo the                        procedure.                       After obtaining informed consent, the scope was passed                        under direct vision. Throughout the procedure, the  patient's blood pressure, pulse, and oxygen saturations                        were monitored continuously. The Endosonoscope was                        introduced through the mouth, and used to inject                        contrast  into and used to inject contrast into the bile                        duct. The ERCP was accomplished without difficulty. The                        patient tolerated the procedure well. Findings:      The scout film was normal. The esophagus was successfully intubated       under direct vision. The scope was advanced to a normal major papilla in       the descending duodenum without detailed examination of the pharynx,       larynx and associated structures, and upper GI tract. The upper GI tract       was grossly normal. The bile duct was deeply cannulated with the       short-nosed traction sphincterotome. Contrast was injected. I personally       interpreted the bile duct images. There was brisk flow of contrast       through the ducts. Image quality was excellent. Contrast extended to the       hepatic ducts. The main bile duct was diffusely dilated. A wire was       passed into the biliary tree. A 6 mm biliary sphincterotomy was made       with a traction (standard) sphincterotome using ERBE electrocautery.       There was no post-sphincterotomy bleeding. The biliary tree was swept       with a 15 mm balloon starting at the bifurcation. Sludge was swept from       the duct. Impression:           - The entire main bile duct was dilated.                       - A biliary sphincterotomy was performed.                       - The biliary tree was swept and sludge was found. Recommendation:       - Watch for pancreatitis, bleeding, perforation, and                        cholangitis. Procedure Code(s):    --- Professional ---                       [phone], Endoscopic retrograde cholangiopancreatography                        (ERCP); with removal of calculi/debris from                        biliary/pancreatic duct(s)  16109, Endoscopic retrograde cholangiopancreatography                        (ERCP); with sphincterotomy/papillotomy                       [phone],  Endoscopic catheterization of the biliary ductal                        system, radiological supervision and interpretation Diagnosis Code(s):    --- Professional ---                       K83.8, Other specified diseases of biliary tract CPT copyright 2016 American Medical Association. All rights reserved. The codes documented in this report are preliminary and upon coder review may  be revised to meet current compliance requirements. Midge Minium MD, MD 12/05/2015 2:03:42 PM This report has been signed electronically. Number of Addenda: 0 Note Initiated On: 12/05/2015 1:18 PM      [facility]

## 2015-12-05 NOTE — OR Nursing (Signed)
1.36 patient intubated by anasthesia.

## 2015-12-05 NOTE — Progress Notes (Signed)
CC: GS panreatitis  Subjective: Significant improvement over the last 24 hrs. Underwent ERCP w sphinterotomy, sludge in the CBD. She reports no abdominal pain. Tolerating clears, no n/v. No fevers Lipase trending down as well as wbc  Objective: Vital signs in last 24 hours: Temp:  [97.9 F (36.6 C)-98.7 F (37.1 C)] 98 F (36.7 C) (10/10 1541) Pulse Rate:  [60-90] 73 (10/10 1541) Resp:  [14-26] 14 (10/10 1541) BP: (112-132)/(69-100) 116/74 (10/10 1541) SpO2:  [99 %-100 %] 100 % (10/10 1541) Last BM Date: 12/03/15  Intake/Output from previous day: 10/09 0701 - 10/10 0700 In: 2950 [I.V.:2950] Out: 350 [Urine:350] Intake/Output this shift: No intake/output data recorded.  Physical exam: NAD awake alert Abd: soft, NT, no peritonitis Ext: well perfused, no edema. Neuro: no focal motor or sensory deficits. Competent.  Lab Results: CBC   Recent Labs  12/03/15 1730 12/04/15 0552  WBC 10.5 8.8  HGB 14.8 13.3  HCT 45.0 40.7  PLT 294 248   BMET  Recent Labs  12/04/15 0552 12/05/15 0513  NA 137 137  K 3.9 4.1  CL 108 109  CO2 21* 20*  GLUCOSE 81 75  BUN 8 8  CREATININE 0.91 0.83  CALCIUM 8.7* 8.6*   PT/INR No results for input(s): LABPROT, INR in the last 72 hours. ABG No results for input(s): PHART, HCO3 in the last 72 hours.  Invalid input(s): PCO2, PO2  Studies/Results: Koreas Abdomen Limited Ruq  Result Date: 12/03/2015 CLINICAL DATA:  Initial evaluation for acute right upper quadrant and epigastric pain for 4 hours. Elevated LFTs with elevated lipase. EXAM: US ABDOMEN LIMITED - RIGHT UPPER QUADRANT COMPARISON:  None available. FINDINGS: Gallbladder: Multiple shadowing echogenic stones present within the gallbladder lumen, largest of which measured approximately 4.7 mm. Associated gallbladder sludge present. Gallbladder wall was thickened up to 7 mm. No frank pericholecystic free fluid. Unable to assess for sonographic Eulah PontMurphy sign as the patient was medicated.  Common bile duct: Diameter: 10 mm. Possible shadowing echogenic stone measuring approximately 6 mm noted within the distal common bile duct, suggestive of choledocholithiasis. Liver: No focal lesion identified. Within normal limits in parenchymal echogenicity. Mild intrahepatic biliary dilatation. IMPRESSION: 1. Cholelithiasis and sludge within the gallbladder lumen with associated gallbladder wall thickening. Given the provided history, findings are concerning for possible acute cholecystitis. 2. Dilatation of the common bile duct up to 10 mm. 6 mm echogenic focus within the distal common bile duct suggestive of an obstructive stone. Electronically Signed   By: Rise MuBenjamin  McClintock M.D.   On: 12/03/2015 20:48    Anti-infectives: Anti-infectives    Start     Dose/Rate Route Frequency Ordered Stop   12/03/15 2115  piperacillin-tazobactam (ZOSYN) IVPB 3.375 g     3.375 g 100 mL/hr over 30 Minutes Intravenous  Once 12/03/15 2113 12/03/15 2239      Assessment/Plan: GS pancreatitis improving with medical rx. We will schedule lap chole for Thursday for Dr. Tonita CongWoodham. Procedure d/w the pt and family in detail. They understand and wish to proceed.  Sandra Bigiego Davaughn Hillyard, MD, [place] [facility]  12/05/2015

## 2015-12-06 ENCOUNTER — Encounter: Payer: Self-pay | Admitting: Gastroenterology

## 2015-12-06 LAB — COMPREHENSIVE METABOLIC PANEL
ALBUMIN: 3.4 g/dL — AB (ref 3.5–5.0)
ALT: 99 U/L — ABNORMAL HIGH (ref 14–54)
ANION GAP: 8 (ref 5–15)
AST: 45 U/L — ABNORMAL HIGH (ref 15–41)
Alkaline Phosphatase: 107 U/L (ref 38–126)
BUN: 6 mg/dL (ref 6–20)
CHLORIDE: 108 mmol/L (ref 101–111)
CO2: 20 mmol/L — AB (ref 22–32)
Calcium: 8.7 mg/dL — ABNORMAL LOW (ref 8.9–10.3)
Creatinine, Ser: 0.71 mg/dL (ref 0.44–1.00)
GFR calc non Af Amer: >60 mL/min (ref >60)
GLUCOSE: 104 mg/dL — AB (ref 65–99)
Potassium: 3.3 mmol/L — ABNORMAL LOW (ref 3.5–5.1)
SODIUM: 136 mmol/L (ref 135–145)
Total Bilirubin: 0.4 mg/dL (ref 0.3–1.2)
Total Protein: 6.7 g/dL (ref 6.5–8.1)

## 2015-12-06 LAB — CBC
HEMATOCRIT: 37.4 % (ref 35.0–47.0)
HEMOGLOBIN: 12.6 g/dL (ref 12.0–16.0)
MCH: 26.4 pg (ref 26.0–34.0)
MCHC: 33.6 g/dL (ref 32.0–36.0)
MCV: 78.5 fL — ABNORMAL LOW (ref 80.0–100.0)
Platelets: 207 10*3/uL (ref 150–440)
RBC: 4.76 MIL/uL (ref 3.80–5.20)
RDW: 14.2 % (ref 11.5–14.5)
WBC: 6.4 10*3/uL (ref 3.6–11.0)

## 2015-12-06 LAB — LIPASE, BLOOD
LIPASE: 277 U/L — AB (ref 11–51)
LIPASE: 291 U/L — AB (ref 11–51)

## 2015-12-06 LAB — SURGICAL PCR SCREEN
MRSA, PCR: NEGATIVE
STAPHYLOCOCCUS AUREUS: NEGATIVE

## 2015-12-06 MED ORDER — POTASSIUM CHLORIDE CRYS ER 20 MEQ PO TBCR
40.0000 meq | EXTENDED_RELEASE_TABLET | Freq: Once | ORAL | Status: AC
  Administered 2015-12-06: 40 meq via ORAL
  Filled 2015-12-06: qty 2

## 2015-12-06 MED ORDER — ACETAMINOPHEN 325 MG PO TABS: 650.0000 mg | ORAL_TABLET | Freq: Four times a day (QID) | ORAL | Status: DC | PRN

## 2015-12-06 NOTE — Progress Notes (Signed)
[employer] [facility] - [place] [facility]   PATIENT NAME: Sandra AlmasMartha Archer    MR#:  [id number]  DATE OF BIRTH:  08/10/1995  SUBJECTIVE:  CHIEF COMPLAINT:   Chief Complaint  Patient presents with  . Cholelithiasis   much better, laparoscopic cholecystectomy tomorrow REVIEW OF SYSTEMS:  Review of Systems  Constitutional: Negative for chills, fever and weight loss.  HENT: Negative for nosebleeds and sore throat.   Eyes: Negative for blurred vision.  Respiratory: Negative for cough, shortness of breath and wheezing.   Cardiovascular: Negative for chest pain, orthopnea, leg swelling and PND.  Gastrointestinal: Positive for abdominal pain and nausea. Negative for constipation, diarrhea, heartburn and vomiting.  Genitourinary: Negative for dysuria and urgency.  Musculoskeletal: Negative for back pain.  Skin: Negative for rash.  Neurological: Negative for dizziness, speech change, focal weakness and headaches.  Endo/Heme/Allergies: Does not bruise/bleed easily.  Psychiatric/Behavioral: Negative for depression.   DRUG ALLERGIES:   Allergies  Allergen Reactions  . Coconut Oil Swelling   VITALS:  Blood pressure (!) 112/59, pulse (!) 57, temperature 98.4 F (36.9 C), temperature source Oral, resp. rate 17, height 5\' 5"  (1.651 m), weight 110.9 kg (244 lb 6.4 oz), last menstrual period 11/12/2015, SpO2 98 %. PHYSICAL EXAMINATION:  Physical Exam  Constitutional: She is oriented to person, place, and time and well-developed, well-nourished, and in no distress.  HENT:  Head: Normocephalic and atraumatic.  Eyes: Conjunctivae and EOM are normal. Pupils are equal, round, and reactive to light.  Neck: Normal range of motion. Neck supple. No tracheal deviation present. No thyromegaly present.  Cardiovascular: Normal rate, regular rhythm and normal heart sounds.   Pulmonary/Chest: Effort normal and breath sounds normal. No respiratory distress. She has no wheezes. She exhibits no  tenderness.  Abdominal: Soft. Bowel sounds are normal. She exhibits no distension.  Musculoskeletal: Normal range of motion.  Neurological: She is alert and oriented to person, place, and time. No cranial nerve deficit.  Skin: Skin is warm and dry. No rash noted.  Psychiatric: Mood and affect normal.   LABORATORY PANEL:   CBC  Recent Labs Lab 12/06/15 0535  WBC 6.4  HGB 12.6  HCT 37.4  PLT 207   ------------------------------------------------------------------------------------------------------------------ Chemistries   Recent Labs Lab 12/05/15 0513  NA 137  K 4.1  CL 109  CO2 20*  GLUCOSE 75  BUN 8  CREATININE 0.83  CALCIUM 8.6*  AST 102*  ALT 158*  ALKPHOS 140*  BILITOT 0.8   RADIOLOGY:  No results found. ASSESSMENT AND PLAN:  20 year old female being admitted for acute gall stone pancreatitis  * Acute gallstone pancreatitis   - Lipase continues to improve, monitor - Continue when necessary pain meds and antiemetics  - s/p ERCP and biliary sphincterotomy on 10/10  * Acute cholecystitis with cholelithiasis - Lap cholecystectomy planned for Thursday per surgery  * Hypokalemia - Repleted and resolved  * Hypotension - Monitor while on IV fluids    All the records are reviewed and case discussed with Care Management/Social Worker. Management plans discussed with the patient, nursing, family (mother at bedside) and they are in agreement.  CODE STATUS: FULL CODE  TOTAL TIME TAKING CARE OF THIS PATIENT: 35 minutes.   More than 50% of the time was spent in counseling/coordination of care: YES  POSSIBLE D/C IN 1-2 DAYS, DEPENDING ON CLINICAL CONDITION.  surgical evaluation, lap chole planned for thursday   [facility], Marabella Popiel M.D on 12/06/2015 at 11:23 AM  Between 7am to 6pm -  Pager - [phone]  After 6pm go to [email or web address] - Social research officer, government  [facility]  Office  [phone]  CC: Primary care physician; Karena Addison, CNM  Note: This dictation was prepared with Dragon dictation along with smaller phrase technology. Any transcriptional errors that result from this process are unintentional.

## 2015-12-06 NOTE — Progress Notes (Signed)
Revisited with patient this evening. She's been tolerating a diet all day without any return of pain or nausea.  Discussed with the patient that she is now ready for a laparoscopic cholecystectomy. I discussed the procedure in detail.  We discussed the risks and benefits of a laparoscopic cholecystectomy and possible cholangiogram including, but not limited to bleeding, infection, injury to surrounding structures such as the intestine or liver, bile leak, retained gallstones, need to convert to an open procedure, prolonged diarrhea, blood clots such as  DVT, common bile duct injury, anesthesia risks, and possible need for additional procedures.  The likelihood of improvement in symptoms and return to the patient's normal status is good. We discussed the typical post-operative recovery course.  The patient voiced understanding and desired to proceed. Plan for surgery tomorrow morning.  Ricarda Frameharles Deniel Mcquiston, MD [facility] General Surgeon [facility]

## 2015-12-06 NOTE — Progress Notes (Signed)
CC: Abdominal pain Subjective: Patient reports that her abdominal pain has continued to improve since her ERCP. She has been tolerating a diet without nausea, vomiting, worsening pain.  Objective: Vital signs in last 24 hours: Temp:  [98 F (36.7 C)-98.7 F (37.1 C)] 98.4 F (36.9 C) (10/11 0528) Pulse Rate:  [57-79] 57 (10/11 0528) Resp:  [14-26] 17 (10/11 0528) BP: (112-132)/(59-100) 112/59 (10/11 0528) SpO2:  [97 %-100 %] 98 % (10/11 0528) Last BM Date: 12/04/15  Intake/Output from previous day: 10/10 0701 - 10/11 0700 In: 2168 [P.O.:240; I.V.:1928] Out: 2445 [Urine:2445] Intake/Output this shift: Total I/O In: 262 [I.V.:262] Out: 0   Physical exam:  Gen.: No acute distress Chest: Clear to auscultation Heart: Regular rhythm Abdomen: Soft, minimally tender to palpation in the midepigastric region, nondistended.  Lab Results: CBC   Recent Labs  12/04/15 0552 12/06/15 0535  WBC 8.8 6.4  HGB 13.3 12.6  HCT 40.7 37.4  PLT 248 207   BMET  Recent Labs  12/04/15 0552 12/05/15 0513  NA 137 137  K 3.9 4.1  CL 108 109  CO2 21* 20*  GLUCOSE 81 75  BUN 8 8  CREATININE 0.91 0.83  CALCIUM 8.7* 8.6*   PT/INR No results for input(s): LABPROT, INR in the last 72 hours. ABG No results for input(s): PHART, HCO3 in the last 72 hours.  Invalid input(s): PCO2, PO2  Studies/Results: No results found.  Anti-infectives: Anti-infectives    Start     Dose/Rate Route Frequency Ordered Stop   12/03/15 2115  piperacillin-tazobactam (ZOSYN) IVPB 3.375 g     3.375 g 100 mL/hr over 30 Minutes Intravenous  Once 12/03/15 2113 12/03/15 2239      Assessment/Plan:  20 year old female admitted with gallstone pancreatitis now status post ERCP. Her labs are trending towards normal but are not quite normal today. Discussed that I would return later today to fully go over the operative planning for tomorrow. Encourage ambulation, oral intake, incentive spirometer usage.  Tentatively planned for surgery tomorrow.  Reha Martinovich T. Tonita CongWoodham, MD, FACS  12/06/2015

## 2015-12-07 ENCOUNTER — Encounter
Admission: EM | Disposition: A | Discharge status: Home / Self Care | Payer: Self-pay | Source: Home / Self Care | Attending: Internal Medicine

## 2015-12-07 ENCOUNTER — Inpatient Hospital Stay: Payer: BLUE CROSS/BLUE SHIELD | Admitting: Certified Registered"

## 2015-12-07 ENCOUNTER — Encounter: Payer: Self-pay | Admitting: *Deleted

## 2015-12-07 ENCOUNTER — Inpatient Hospital Stay: Payer: BLUE CROSS/BLUE SHIELD

## 2015-12-07 HISTORY — PX: CHOLECYSTECTOMY: SHX55

## 2015-12-07 LAB — BASIC METABOLIC PANEL
ANION GAP: 5 (ref 5–15)
BUN: 6 mg/dL (ref 6–20)
CALCIUM: 8.6 mg/dL — AB (ref 8.9–10.3)
CHLORIDE: 113 mmol/L — AB (ref 101–111)
CO2: 23 mmol/L (ref 22–32)
Creatinine, Ser: 0.82 mg/dL (ref 0.44–1.00)
GFR calc non Af Amer: >60 mL/min (ref >60)
Glucose, Bld: 102 mg/dL — ABNORMAL HIGH (ref 65–99)
Potassium: 3.7 mmol/L (ref 3.5–5.1)
Sodium: 141 mmol/L (ref 135–145)

## 2015-12-07 LAB — LIPASE, BLOOD: Lipase: 304 U/L — ABNORMAL HIGH (ref 11–51)

## 2015-12-07 SURGERY — LAPAROSCOPIC CHOLECYSTECTOMY
Anesthesia: General | Wound class: Clean Contaminated

## 2015-12-07 MED ORDER — LIDOCAINE HCL (PF) 1 % IJ SOLN
INTRAMUSCULAR | Status: AC
  Filled 2015-12-07: qty 30

## 2015-12-07 MED ORDER — PROPOFOL 10 MG/ML IV BOLUS
INTRAVENOUS | Status: DC | PRN
  Administered 2015-12-07: 100 mg via INTRAVENOUS
  Administered 2015-12-07: 200 mg via INTRAVENOUS

## 2015-12-07 MED ORDER — LACTATED RINGERS IV SOLN
INTRAVENOUS | Status: DC | PRN
  Administered 2015-12-07: 12:00:00 via INTRAVENOUS

## 2015-12-07 MED ORDER — BUPIVACAINE HCL (PF) 0.5 % IJ SOLN
INTRAMUSCULAR | Status: AC
  Filled 2015-12-07: qty 30

## 2015-12-07 MED ORDER — LIDOCAINE HCL 1 % IJ SOLN
INTRAMUSCULAR | Status: DC | PRN
  Administered 2015-12-07: 18 mL via SUBCUTANEOUS

## 2015-12-07 MED ORDER — IOTHALAMATE MEGLUMINE 60 % INJ SOLN
INTRAMUSCULAR | Status: DC | PRN
  Administered 2015-12-07: 20 mL

## 2015-12-07 MED ORDER — SUGAMMADEX SODIUM 200 MG/2ML IV SOLN
INTRAVENOUS | Status: DC | PRN
  Administered 2015-12-07: 220 mg via INTRAVENOUS

## 2015-12-07 MED ORDER — LIDOCAINE HCL (CARDIAC) 20 MG/ML IV SOLN
INTRAVENOUS | Status: DC | PRN
  Administered 2015-12-07: 100 mg via INTRAVENOUS

## 2015-12-07 MED ORDER — DEXAMETHASONE SODIUM PHOSPHATE 10 MG/ML IJ SOLN
INTRAMUSCULAR | Status: DC | PRN
  Administered 2015-12-07: 10 mg via INTRAVENOUS

## 2015-12-07 MED ORDER — OXYCODONE-ACETAMINOPHEN 5-325 MG PO TABS: 1.0000 | ORAL_TABLET | ORAL | 0 refills | Status: DC | PRN

## 2015-12-07 MED ORDER — ONDANSETRON HCL 4 MG/2ML IJ SOLN
INTRAMUSCULAR | Status: DC | PRN
  Administered 2015-12-07: 4 mg via INTRAVENOUS

## 2015-12-07 MED ORDER — FENTANYL CITRATE (PF) 100 MCG/2ML IJ SOLN
INTRAMUSCULAR | Status: DC | PRN
  Administered 2015-12-07 (×4): 50 ug via INTRAVENOUS

## 2015-12-07 MED ORDER — FENTANYL CITRATE (PF) 100 MCG/2ML IJ SOLN
25.0000 ug | INTRAMUSCULAR | Status: AC | PRN
  Administered 2015-12-07 (×6): 25 ug via INTRAVENOUS

## 2015-12-07 MED ORDER — ROCURONIUM BROMIDE 100 MG/10ML IV SOLN
INTRAVENOUS | Status: DC | PRN
  Administered 2015-12-07 (×2): 20 mg via INTRAVENOUS
  Administered 2015-12-07: 30 mg via INTRAVENOUS

## 2015-12-07 MED ORDER — MIDAZOLAM HCL 2 MG/2ML IJ SOLN
INTRAMUSCULAR | Status: DC | PRN
  Administered 2015-12-07: 2 mg via INTRAVENOUS

## 2015-12-07 MED ORDER — FENTANYL CITRATE (PF) 100 MCG/2ML IJ SOLN
INTRAMUSCULAR | Status: AC
  Administered 2015-12-07: 25 ug via INTRAVENOUS
  Filled 2015-12-07: qty 2

## 2015-12-07 MED ORDER — ONDANSETRON HCL 4 MG/2ML IJ SOLN: 4.0000 mg | Freq: Once | INTRAMUSCULAR | Status: DC | PRN

## 2015-12-07 MED ORDER — CEFAZOLIN SODIUM 1 G IJ SOLR
INTRAMUSCULAR | Status: DC | PRN
Start: 2015-12-07 — End: 2015-12-07
  Administered 2015-12-07: 2 g via INTRAMUSCULAR

## 2015-12-07 SURGICAL SUPPLY — 43 items
ADHESIVE MASTISOL STRL (MISCELLANEOUS) ×3 IMPLANT
APPLIER CLIP ROT 10 11.4 M/L (STAPLE) ×3
BAG COUNTER SPONGE EZ (MISCELLANEOUS) IMPLANT
BLADE SURG SZ11 CARB STEEL (BLADE) ×3 IMPLANT
CANISTER SUCT 1200ML W/VALVE (MISCELLANEOUS) ×3 IMPLANT
CATH CHOLANG 76X19 KUMAR (CATHETERS) ×3 IMPLANT
CHLORAPREP W/TINT 26ML (MISCELLANEOUS) ×3 IMPLANT
CLIP APPLIE ROT 10 11.4 M/L (STAPLE) ×1 IMPLANT
CLOSURE WOUND 1/2 X4 (GAUZE/BANDAGES/DRESSINGS)
CONRAY 60ML FOR OR (MISCELLANEOUS) IMPLANT
COUNTER SPONGE BAG EZ (MISCELLANEOUS)
DRAPE SHEET LG 3/4 BI-LAMINATE (DRAPES) ×3 IMPLANT
DRESSING TELFA 4X3 1S ST N-ADH (GAUZE/BANDAGES/DRESSINGS) ×3 IMPLANT
DRSG TEGADERM 2-3/8X2-3/4 SM (GAUZE/BANDAGES/DRESSINGS) ×12 IMPLANT
ELECT REM PT RETURN 9FT ADLT (ELECTROSURGICAL) ×3
ELECTRODE REM PT RTRN 9FT ADLT (ELECTROSURGICAL) ×1 IMPLANT
GLOVE BIO SURGEON STRL SZ7.5 (GLOVE) ×3 IMPLANT
GLOVE INDICATOR 8.0 STRL GRN (GLOVE) ×3 IMPLANT
GOWN STRL REUS W/ TWL LRG LVL3 (GOWN DISPOSABLE) ×3 IMPLANT
GOWN STRL REUS W/TWL LRG LVL3 (GOWN DISPOSABLE) ×6
GRASPER SUT TROCAR 14GX15 (MISCELLANEOUS) ×3 IMPLANT
IRRIGATION STRYKERFLOW (MISCELLANEOUS) IMPLANT
IRRIGATOR STRYKERFLOW (MISCELLANEOUS)
IV NS 1000ML (IV SOLUTION)
IV NS 1000ML BAXH (IV SOLUTION) IMPLANT
L-HOOK LAP DISP 36CM (ELECTROSURGICAL) ×3
LABEL OR SOLS (LABEL) ×3 IMPLANT
LHOOK LAP DISP 36CM (ELECTROSURGICAL) ×1 IMPLANT
NEEDLE HYPO 25X1 1.5 SAFETY (NEEDLE) ×3 IMPLANT
NEEDLE VERESS 14GA 120MM (NEEDLE) ×3 IMPLANT
NS IRRIG 500ML POUR BTL (IV SOLUTION) ×3 IMPLANT
PACK LAP CHOLECYSTECTOMY (MISCELLANEOUS) ×3 IMPLANT
PENCIL ELECTRO HAND CTR (MISCELLANEOUS) ×3 IMPLANT
POUCH ENDO CATCH 10MM SPEC (MISCELLANEOUS) ×3 IMPLANT
SCISSORS METZENBAUM CVD 33 (INSTRUMENTS) ×3 IMPLANT
SLEEVE ENDOPATH XCEL 5M (ENDOMECHANICALS) ×6 IMPLANT
STRIP CLOSURE SKIN 1/2X4 (GAUZE/BANDAGES/DRESSINGS) IMPLANT
SUT MNCRL 4-0 (SUTURE) ×2
SUT MNCRL 4-0 27XMFL (SUTURE) ×1
SUTURE MNCRL 4-0 27XMF (SUTURE) ×1 IMPLANT
TROCAR XCEL 12X100 BLDLESS (ENDOMECHANICALS) ×3 IMPLANT
TROCAR XCEL NON-BLD 5MMX100MML (ENDOMECHANICALS) ×3 IMPLANT
TUBING INSUFFLATOR HI FLOW (MISCELLANEOUS) ×3 IMPLANT

## 2015-12-07 NOTE — Brief Op Note (Signed)
12/03/2015 - 12/07/2015  1:31 PM  PATIENT:  Sandra CoganMartha K Archer  20 y.o. female  PRE-OPERATIVE DIAGNOSIS:  Gallstone pancreatitis  POST-OPERATIVE DIAGNOSIS:  Same  PROCEDURE:  Procedure(s): LAPAROSCOPIC CHOLECYSTECTOMY (N/A)  SURGEON:  Surgeon(s) and Role:    * Ricarda Frameharles Alyssamarie Mounsey, MD - Primary  PHYSICIAN ASSISTANT: None  ASSISTANTS: none   ANESTHESIA:   general  EBL:  Total I/O In: 953.3 [I.V.:953.3] Out: 20 [Blood:20]  BLOOD ADMINISTERED:none  DRAINS: none   LOCAL MEDICATIONS USED:  MARCAINE   , XYLOCAINE  and Amount: 18 ml  SPECIMEN:  Source of Specimen:  Gallbladder  DISPOSITION OF SPECIMEN:  PATHOLOGY  COUNTS:  YES  TOURNIQUET:  * No tourniquets in log *  DICTATION: .Dragon Dictation  PLAN OF CARE: Return to inpatient status  PATIENT DISPOSITION:  PACU - hemodynamically stable.   Delay start of Pharmacological VTE agent (>24hrs) due to surgical blood loss or risk of bleeding: no

## 2015-12-07 NOTE — Progress Notes (Signed)
Pt d/c to home today. IV removed intact.  Rx's given to pt w/all questions and concerns addressed.  D/C paperwork reviewed and education provided with all questions and concerns addressed.  Pt family at bedside for home transport.   

## 2015-12-07 NOTE — Transfer of Care (Signed)
Immediate Anesthesia Transfer of Care Note  Patient: Sandra MayhewMartha K Cho  Procedure(s) Performed: Procedure(s): LAPAROSCOPIC CHOLECYSTECTOMY (N/A)  Patient Location: PACU  Anesthesia Type:General  Level of Consciousness: awake, alert , oriented and patient cooperative  Airway & Oxygen Therapy: Patient Spontanous Breathing and Patient connected to face mask oxygen  Post-op Assessment: Report given to RN, Post -op Vital signs reviewed and stable and Patient moving all extremities X 4  Post vital signs: Reviewed and stable  Last Vitals:  Vitals:   12/07/15 1041 12/07/15 1337  BP: 117/74   Pulse: 77   Resp: 18   Temp: (!) 35.6 C (P) 37.6 C    Last Pain:  Vitals:   12/07/15 1041  TempSrc: Tympanic  PainSc:       Patients Stated Pain Goal: 0 (12/04/15 0000)  Complications: No apparent anesthesia complications

## 2015-12-07 NOTE — Anesthesia Procedure Notes (Signed)
Procedure Name: Intubation Date/Time: 12/07/2015 12:22 PM Performed by: Michaele OfferSAVAGE, Murel Shenberger Pre-anesthesia Checklist: Patient identified, Emergency Drugs available, Suction available, Patient being monitored and Timeout performed Patient Re-evaluated:Patient Re-evaluated prior to inductionOxygen Delivery Method: Circle system utilized Preoxygenation: Pre-oxygenation with 100% oxygen Intubation Type: IV induction Ventilation: Mask ventilation without difficulty Laryngoscope Size: Mac and 3 Grade View: Grade I Tube type: Oral Tube size: 7.0 mm Number of attempts: 1 Airway Equipment and Method: Rigid stylet Placement Confirmation: ETT inserted through vocal cords under direct vision,  positive ETCO2 and breath sounds checked- equal and bilateral Secured at: 21 cm Tube secured with: Tape Dental Injury: Teeth and Oropharynx as per pre-operative assessment

## 2015-12-07 NOTE — OR Nursing (Signed)
Urine pregnancy from 12/03/2015 was negative. Dr. Darleene CleaverVan Staveren aware and he states no repeat testing for pregnancy needed today.

## 2015-12-07 NOTE — Discharge Instructions (Signed)
Laparoscopic Cholecystectomy, Care After Refer to this sheet in the next few weeks. These instructions provide you with information about caring for yourself after your procedure. Your health care provider may also give you more specific instructions. Your treatment has been planned according to current medical practices, but problems sometimes occur. Call your health care provider if you have any problems or questions after your procedure. WHAT TO EXPECT AFTER THE PROCEDURE After your procedure, it is common to have:  Pain at your incision sites. You will be given pain medicines to control your pain.  Mild nausea or vomiting. This should improve after the first 24 hours.  Bloating and possible shoulder pain from the gas that was used during the procedure. This will improve after the first 24 hours. HOME CARE INSTRUCTIONS Incision Care  Follow instructions from your health care provider about how to take care of your incisions. Make sure you:  Wash your hands with soap and water before you change your bandage (dressing). If soap and water are not available, use hand sanitizer.  Change your dressing as told by your health care provider.  Leave stitches (sutures), skin glue, or adhesive strips in place. These skin closures may need to be in place for 2 weeks or longer. If adhesive strip edges start to loosen and curl up, you may trim the loose edges. Do not remove adhesive strips completely unless your health care provider tells you to do that.  Do not take baths, swim, or use a hot tub until your health care provider approves. Ask your health care provider if you can take showers. (OK TO SHOWER IN 24 hours) You may only be allowed to take sponge baths for bathing. General Instructions  Take over-the-counter and prescription medicines only as told by your health care provider.  Do not drive or operate heavy machinery while taking prescription pain medicine.  Return to your normal diet as  told by your health care provider.  Do not lift anything that is heavier than 10 lb (4.5 kg).  Do not play contact sports for one week or until your health care provider approves. SEEK MEDICAL CARE IF:   You have redness, swelling, or pain at the site of your incision.  You have fluid, blood, or pus coming from your incision.  You notice a bad smell coming from your incision area.  Your surgical incisions break open.  You have a fever. SEEK IMMEDIATE MEDICAL CARE IF:  You develop a rash.  You have difficulty breathing.  You have chest pain.  You have increasing pain in your shoulders (shoulder strap areas).  You faint or have dizzy episodes while you are standing.  You have severe pain in your abdomen.  You have nausea or vomiting that lasts for more than one day.   This information is not intended to replace advice given to you by your health care provider. Make sure you discuss any questions you have with your health care provider.   Document Released: 02/11/2005 Document Revised: 11/02/2014 Document Reviewed: 09/23/2012 Elsevier Interactive Patient Education [employer]2016 [employer] Inc.

## 2015-12-07 NOTE — Op Note (Signed)
Laparoscopic Cholecystectomy  Pre-operative Diagnosis: Gallstone pancreatitis  Post-operative Diagnosis: Same  Procedure: Laparoscopic cholecystectomy with intraoperative cholangiogram  Surgeon: Leonette Most T. Tonita Cong, MD FACS  Anesthesia: Gen. with endotracheal tube  Assistant: None  Procedure Details  The patient was seen again in the Holding Room. The benefits, complications, treatment options, and expected outcomes were discussed with the patient. The risks of bleeding, infection, recurrence of symptoms, failure to resolve symptoms, bile duct damage, bile duct leak, retained common bile duct stone, bowel injury, any of which could require further surgery and/or ERCP, stent, or papillotomy were reviewed with the patient. The likelihood of improving the patient's symptoms with return to their baseline status is good.  The patient and/or family concurred with the proposed plan, giving informed consent.  The patient was taken to Operating Room, identified as CALIYA NARINE and the procedure verified as Laparoscopic Cholecystectomy.  A Time Out was held and the above information confirmed.  Prior to the induction of general anesthesia, antibiotic prophylaxis was administered. VTE prophylaxis was in place. General endotracheal anesthesia was then administered and tolerated well. After the induction, the abdomen was prepped with Chloraprep and draped in the sterile fashion. The patient was positioned in the supine position.  Local anesthetic  was injected into the skin near the umbilicus and an incision made. The Veress needle was placed. Pneumoperitoneum was then created with CO2 and tolerated well without any adverse changes in the patient's vital signs. A 5mm port was placed in the periumbilical position and the abdominal cavity was explored.  Two 5-mm ports were placed in the right upper quadrant and a 12 mm epigastric port was placed all under direct vision. All skin incisions  were infiltrated  with a local anesthetic agent before making the incision and placing the trocars.   The patient was positioned  in reverse Trendelenburg, tilted slightly to the patient's left.  The gallbladder was identified, the fundus grasped and retracted cephalad. There were evidence of inflammatory adhesions both medially and laterally to the gallbladder to the liver as well as a fluffy appearance of the porta hepatis consistent with prior pancreatitis. Adhesions were lysed bluntly. The infundibulum was grasped and retracted laterally, exposing the peritoneum overlying the triangle of Calot. This was then divided and exposed in a blunt fashion. A critical view of the cystic duct and cystic artery was obtained.  The cystic duct was clearly identified and bluntly dissected.   At this point a cholangiogram was performed using a Kumar clamp and catheter. The initial images showed a filling defect in the distal common bile duct. As additional contrast was injected this was able to be flushed out of the duct without any further intervention. A complete cholangiogram showing the cystic, common and right and left hepatic ducts emptying into the duodenum was able to then be seen without any evidence of further filling defect. The Kumar catheter and clamp were then removed under direct visualization. The previously identified duct and artery was serially clipped and cut with EndoShears. An additional posterior branch to the artery was identified and also clipped and cut.  The gallbladder was taken from the gallbladder fossa in a retrograde fashion with the electrocautery. The gallbladder was removed and placed in an Endocatch bag. The liver bed was irrigated and inspected. Hemostasis was achieved with the electrocautery. Copious irrigation was utilized and was repeatedly aspirated until clear.  The gallbladder and Endocatch sac were then removed through the epigastric port site.   Inspection of the right upper  quadrant was  performed. No bleeding, bile duct injury or leak, or bowel injury was noted. Pneumoperitoneum was released. 4-0 subcuticular Monocryl was used to close the skin. Steristrips and Mastisol and sterile dressings were  applied.  The patient was then extubated and brought to the recovery room in stable condition. Sponge, lap, and needle counts were correct at closure and at the conclusion of the case.   Findings: Inflammatory rind consistent with gallstone pancreatitis  Estimated Blood Loss: 10 mL         Drains: None         Specimens: Gallbladder           Complications: none               Jorge Retz T. Tonita CongWoodham, MD, FACS

## 2015-12-07 NOTE — Anesthesia Preprocedure Evaluation (Signed)
Anesthesia Evaluation  Patient identified by MRN, date of birth, ID band Patient awake    Reviewed: Allergy & Precautions, NPO status , Patient's Chart, lab work & pertinent test results  Airway Mallampati: II       Dental  (+) Teeth Intact   Pulmonary neg pulmonary ROS,    breath sounds clear to auscultation       Cardiovascular Exercise Tolerance: Good  Rhythm:Regular Rate:Normal     Neuro/Psych negative neurological ROS     GI/Hepatic negative GI ROS, Neg liver ROS,   Endo/Other  negative endocrine ROS  Renal/GU negative Renal ROS     Musculoskeletal   Abdominal Normal abdominal exam  (+)   Peds negative pediatric ROS (+)  Hematology negative hematology ROS (+)   Anesthesia Other Findings   Reproductive/Obstetrics                             Anesthesia Physical Anesthesia Plan  ASA: I  Anesthesia Plan: General   Post-op Pain Management:    Induction: Intravenous  Airway Management Planned: Natural Airway and Nasal Cannula  Additional Equipment:   Intra-op Plan:   Post-operative Plan: Extubation in OR  Informed Consent: I have reviewed the patients History and Physical, chart, labs and discussed the procedure including the risks, benefits and alternatives for the proposed anesthesia with the patient or authorized representative who has indicated his/her understanding and acceptance.     Plan Discussed with: CRNA  Anesthesia Plan Comments:         Anesthesia Quick Evaluation

## 2015-12-07 NOTE — Progress Notes (Signed)
[facility] [place] at [facility]   PATIENT NAME: Sandra Archer    MR#:  [id number]  DATE OF BIRTH:  12-22-1995  SUBJECTIVE:  CHIEF COMPLAINT:   Chief Complaint  Patient presents with  . Cholelithiasis     REVIEW OF SYSTEMS:  Review of Systems  Constitutional: Negative for chills, fever and weight loss.  HENT: Negative for nosebleeds and sore throat.   Eyes: Negative for blurred vision.  Respiratory: Negative for cough, shortness of breath and wheezing.   Cardiovascular: Negative for chest pain, orthopnea, leg swelling and PND.  Gastrointestinal: Negative for abdominal pain, constipation, diarrhea, heartburn, nausea and vomiting.  Genitourinary: Negative for dysuria and urgency.  Musculoskeletal: Negative for back pain.  Skin: Negative for rash.  Neurological: Negative for dizziness, speech change, focal weakness and headaches.  Endo/Heme/Allergies: Does not bruise/bleed easily.  Psychiatric/Behavioral: Negative for depression.   DRUG ALLERGIES:   Allergies  Allergen Reactions  . Coconut Oil Swelling   VITALS:  Blood pressure 117/74, pulse 77, temperature (!) 96.1 F (35.6 C), temperature source Tympanic, resp. rate 18, height 5\' 5"  (1.651 m), weight 244 lb 6.4 oz (110.9 kg), last menstrual period 11/12/2015, SpO2 99 %. PHYSICAL EXAMINATION:  Physical Exam  Constitutional: She is oriented to person, place, and time and well-developed, well-nourished, and in no distress.  HENT:  Head: Normocephalic and atraumatic.  Eyes: Conjunctivae and EOM are normal. Pupils are equal, round, and reactive to light.  Neck: Normal range of motion. Neck supple. No tracheal deviation present. No thyromegaly present.  Cardiovascular: Normal rate, regular rhythm and normal heart sounds.   Pulmonary/Chest: Effort normal and breath sounds normal. No respiratory distress. She has no wheezes. She exhibits no tenderness.  Abdominal: Soft. Bowel sounds are normal. She exhibits  no distension.  Musculoskeletal: Normal range of motion.  Neurological: She is alert and oriented to person, place, and time. No cranial nerve deficit.  Skin: Skin is warm and dry. No rash noted.  Psychiatric: Mood and affect normal.   LABORATORY PANEL:   CBC  Recent Labs Lab 12/06/15 0535  WBC 6.4  HGB 12.6  HCT 37.4  PLT 207   ------------------------------------------------------------------------------------------------------------------ Chemistries   Recent Labs Lab 12/06/15 1651 12/07/15 0440  NA 136 141  K 3.3* 3.7  CL 108 113*  CO2 20* 23  GLUCOSE 104* 102*  BUN 6 6  CREATININE 0.71 0.82  CALCIUM 8.7* 8.6*  AST 45*  --   ALT 99*  --   ALKPHOS 107  --   BILITOT 0.4  --    RADIOLOGY:  No results found. ASSESSMENT AND PLAN:  20 year old female being admitted for acute gall stone pancreatitis  * Acute gallstone pancreatitis   - For lap cholecystectomy later today - s/p ERCP and biliary sphincterotomy on 10/10  * Acute cholecystitis with cholelithiasis - Laparoscopic cholecystectomy today  * Hypokalemia - Repleted and resolved  * Hypotension - Resolved  All the records are reviewed and case discussed with Care Management/Social Worker. Management plans discussed with the patient, nursing, family (mother at bedside) and they are in agreement.  CODE STATUS: FULL CODE  TOTAL TIME TAKING CARE OF THIS PATIENT: 32 minutes.   More than 50% of the time was spent in counseling/coordination of care: YES  POSSIBLE D/C IN 1-2 DAYS, DEPENDING ON CLINICAL CONDITION.  surgical evaluation, lap chole planned for thursday   Auburn Bilberry M.D on 12/07/2015 at 11:08 AM  Between 7am to 6pm - Pager - [phone]  After 6pm go to [email or web address] - Social research officer, governmentpassword [email or web address] [facility]  [facility] [place] [facility]  Office  [phone]  CC: Primary care physician; Karena AddisonSigmon, Meredith C, CNM  Note: This dictation was prepared with Dragon dictation along with smaller  phrase technology. Any transcriptional errors that result from this process are unintentional.

## 2015-12-07 NOTE — Discharge Summary (Signed)
Patient ID: Sandra MayhewMartha K Vitullo MRN: [id number] DOB/AGE: 21/08/1995 21 y.o.  Admit date: 12/03/2015 Discharge date: 12/07/2015  Discharge Diagnoses:  Gallstone Pancreatitis  Procedures Performed: ERCP and Laparoscopic Cholecystectomy  Discharged Condition: good  Hospital Course: Patient admitted with gallstone pancreatitis. Underwent an ERCP and had resolution of her pancreatitis prior to taking her to the operating room for a laparoscopic cholecystectomy. She tolerated the procedure well and was able to be discharged home the afternoon following the surgery.  Discharge Orders: Discharge home, OK to shower in 24 hours. Do not submerge. Replace dressings daily until there is no further drainage.  Disposition: HOME  Discharge Medications:   Medication List    TAKE these medications   oxyCODONE-acetaminophen 5-325 MG tablet Commonly known as:  ROXICET Take 1-2 tablets by mouth every 4 (four) hours as needed.        Follwup: Follow-up Information    Ricarda Frameharles Eriverto Byrnes, MD. Go in 8 day(s).   Specialty:  General Surgery Why:  Appointment scheduled for 10/20 @ 10:15 Contact information: [address] [place] [address] [phone]           Signed: Ricarda FrameCharles Cherissa Hook 12/07/2015, 5:19 PM

## 2015-12-08 LAB — SURGICAL PATHOLOGY

## 2015-12-12 NOTE — Anesthesia Postprocedure Evaluation (Signed)
Anesthesia Post Note  Patient: Sandra Archer  Procedure(s) Performed: Procedure(s) (LRB): LAPAROSCOPIC CHOLECYSTECTOMY (N/A)  Patient location during evaluation: PACU Anesthesia Type: General Level of consciousness: awake Pain management: pain level controlled Vital Signs Assessment: post-procedure vital signs reviewed and stable Respiratory status: spontaneous breathing Cardiovascular status: stable Anesthetic complications: no    Last Vitals:  Vitals:   12/07/15 1445 12/07/15 1559  BP: 133/80 126/82  Pulse: 75 74  Resp:    Temp: 36.7 C 36.8 C    Last Pain:  Vitals:   12/07/15 1559  TempSrc: Oral  PainSc:                  VAN STAVEREN,[name]

## 2015-12-14 ENCOUNTER — Other Ambulatory Visit: Payer: Self-pay

## 2015-12-15 ENCOUNTER — Ambulatory Visit (INDEPENDENT_AMBULATORY_CARE_PROVIDER_SITE_OTHER): Payer: BLUE CROSS/BLUE SHIELD | Admitting: General Surgery

## 2015-12-15 ENCOUNTER — Encounter: Payer: Self-pay | Admitting: General Surgery

## 2015-12-15 DIAGNOSIS — Z4889 Encounter for other specified surgical aftercare: Secondary | ICD-10-CM

## 2015-12-15 NOTE — Patient Instructions (Signed)

## 2015-12-15 NOTE — Progress Notes (Signed)
Outpatient Surgical Follow Up  12/15/2015  Sandra MayhewMartha K Archer is an 20 y.o. female.   Chief Complaint  Patient presents with  . Routine Post Op    Laparoscopic Cholecystectomy (12/07/15)- Dr. Tonita CongWoodham    HPI: 20 year old female returns to clinic for follow-up 1 week status post laparoscopic cholecystectomy. Patient reports doing very well. She denies any pain. She is artery return to school. She denies any fevers, chills, nausea, vomiting, chest pain, shortness breath, diarrhea, constipation. She has been very happy with her surgical experience.  Past Medical History:  Diagnosis Date  . Patient denies medical problems     Past Surgical History:  Procedure Laterality Date  . CHOLECYSTECTOMY N/A 12/07/2015   Procedure: LAPAROSCOPIC CHOLECYSTECTOMY;  Surgeon: Ricarda Frameharles Hammond Obeirne, MD;  Location: [facility] ORS;  Service: General;  Laterality: N/A;  . ENDOSCOPIC RETROGRADE CHOLANGIOPANCREATOGRAPHY (ERCP) WITH PROPOFOL N/A 12/05/2015   Procedure: ENDOSCOPIC RETROGRADE CHOLANGIOPANCREATOGRAPHY (ERCP) WITH PROPOFOL;  Surgeon: Midge Miniumarren Wohl, MD;  Location: [facility] ENDOSCOPY;  Service: Endoscopy;  Laterality: N/A;  . TONSILLECTOMY      Family History  Problem Relation Age of Onset  . Gallstones Mother   . Healthy Father   . CAD Paternal Grandfather   . Heart disease Paternal Grandfather   . Cancer Maternal Aunt     Breast  . Cancer Maternal Grandmother     Liver    Social History:  reports that she has never smoked. She has never used smokeless tobacco. She reports that she drinks alcohol. She reports that she does not use drugs.  Allergies:  Allergies  Allergen Reactions  . Coconut Oil Swelling    Medications reviewed.    ROS A multipoint review of systems was completed. All pertinent positives and negatives are documented within the history of present illness and the remainder negative.   BP (!) 138/92   Pulse (!) 106   Temp 98.2 F (36.8 C) (Oral)   Ht 5\' 5"  (1.651 m)   Wt 113.4 kg  (250 lb)   LMP 11/12/2015 (Exact Date)   BMI 41.60 kg/m   Physical Exam Gen.: No acute distress Chest: Clear to auscultation Heart: Regular rhythm Abdomen: Soft, nontender, nondistended. Well approximated laparoscopic cholecystectomy incision sites bony evidence of erythema or drainage.    No results found for this or any previous visit (from the past 48 hour(s)). No results found.  Assessment/Plan:  1. Aftercare following surgery 20 year old female status post laparoscopic cholecystectomy. Doing well. Discussed the pathology in detail. Also reviewed standard postoperative precautions for wound care and signs of infection. She voiced understanding and will follow-up in clinic on an as-needed basis.     Ricarda Frameharles Jurnie Garritano, MD FACS General Surgeon  12/15/2015,12:11 PM

## 2016-07-03 DIAGNOSIS — Z23 Encounter for immunization: Secondary | ICD-10-CM | POA: Diagnosis not present

## 2016-09-23 DIAGNOSIS — Z6841 Body Mass Index (BMI) 40.0 and over, adult: Secondary | ICD-10-CM | POA: Diagnosis not present

## 2016-09-23 DIAGNOSIS — Z113 Encounter for screening for infections with a predominantly sexual mode of transmission: Secondary | ICD-10-CM | POA: Diagnosis not present

## 2016-09-23 DIAGNOSIS — Z30018 Encounter for initial prescription of other contraceptives: Secondary | ICD-10-CM | POA: Diagnosis not present

## 2016-09-23 DIAGNOSIS — Z01419 Encounter for gynecological examination (general) (routine) without abnormal findings: Secondary | ICD-10-CM | POA: Diagnosis not present

## 2016-12-17 DIAGNOSIS — Z23 Encounter for immunization: Secondary | ICD-10-CM | POA: Diagnosis not present

## 2017-04-05 DIAGNOSIS — Z23 Encounter for immunization: Secondary | ICD-10-CM | POA: Diagnosis not present

## 2017-09-16 IMAGING — CR DG CHOLANGIOGRAM OPERATIVE
2 series · 15 of 20 positions shown · non-contrast
Comparison: Ultrasound 12/03/2015

CLINICAL DATA: Cholelithiasis.

EXAM:
INTRAOPERATIVE CHOLANGIOGRAM
TECHNIQUE: Cholangiographic images from the C-arm fluoroscopic device were
submitted for interpretation post-operatively. Please see the
procedural report for the amount of contrast and the fluoroscopy
time utilized.

[Series 3: cont. · 7 of 40 frames shown (1 of 2)]
[frame 1/40]
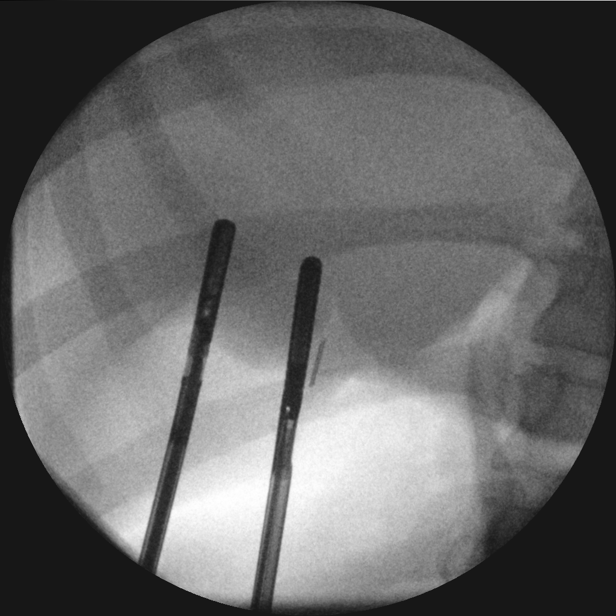
[frame 9/40]
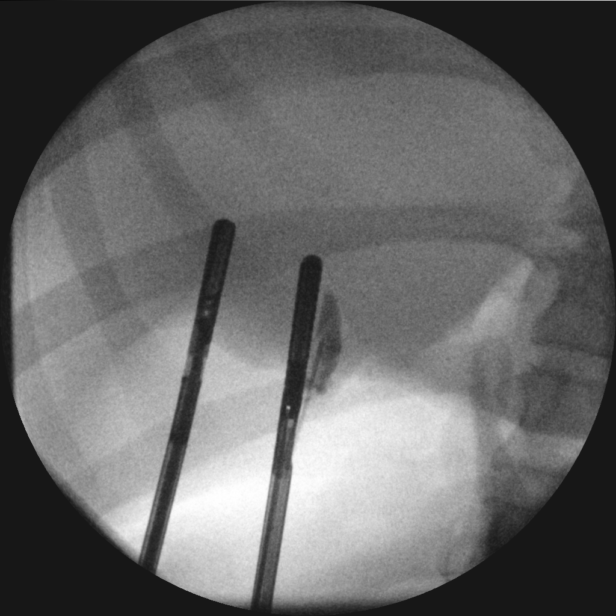
[frame 14/40]
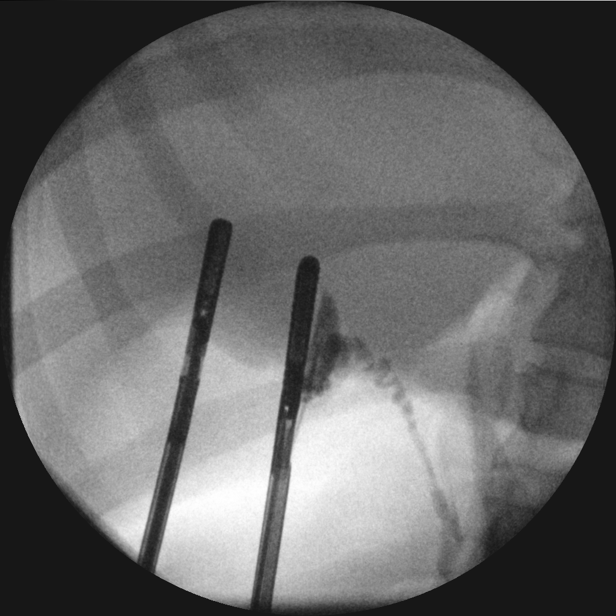
[frame 18/40]
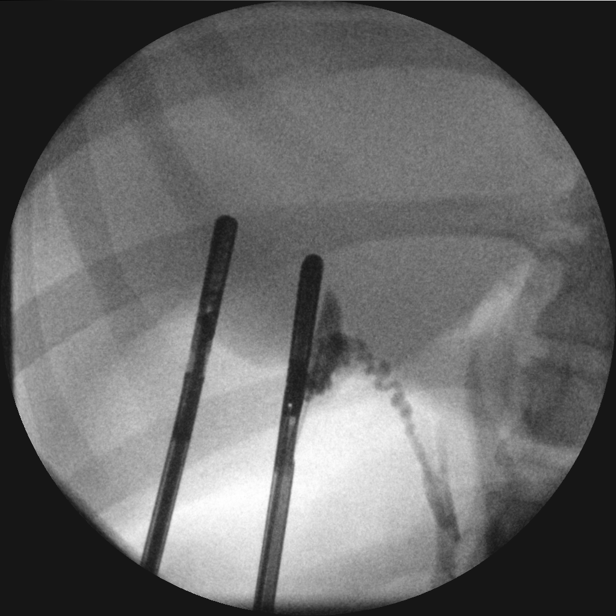
[frame 27/40]
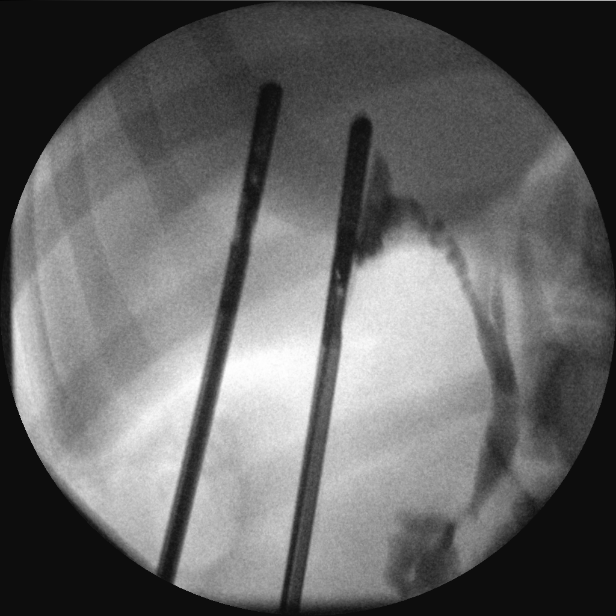
[frame 31/40]
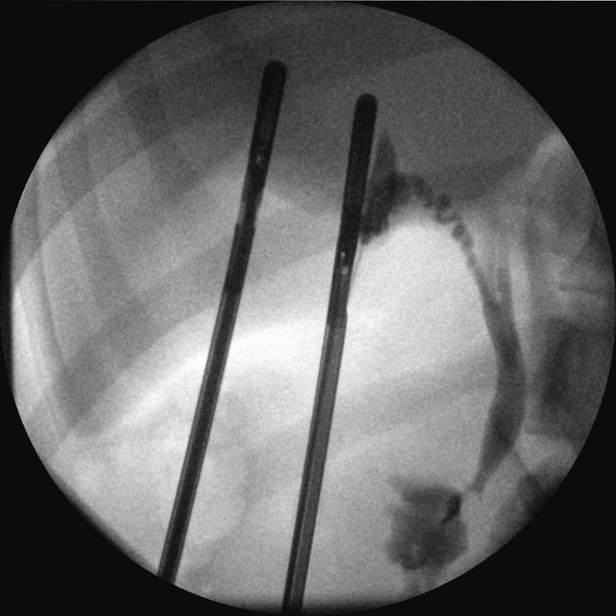
[frame 35/40]
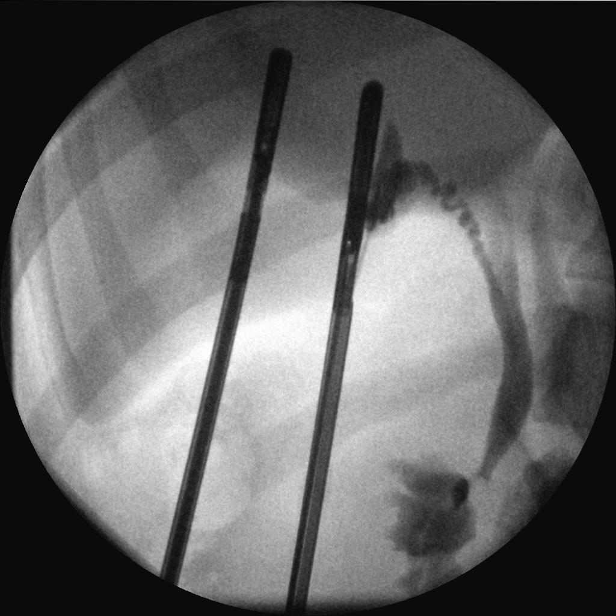

[Series 4: cont. · 8 of 116 frames shown (2 of 2)]
[frame 1/116]
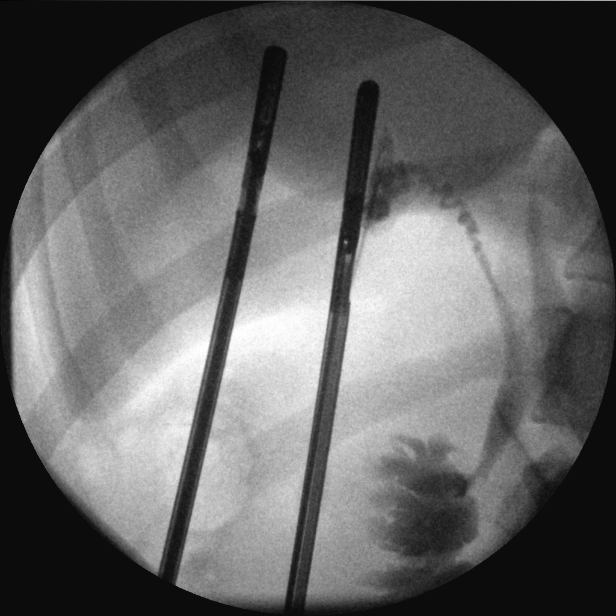
[frame 13/116]
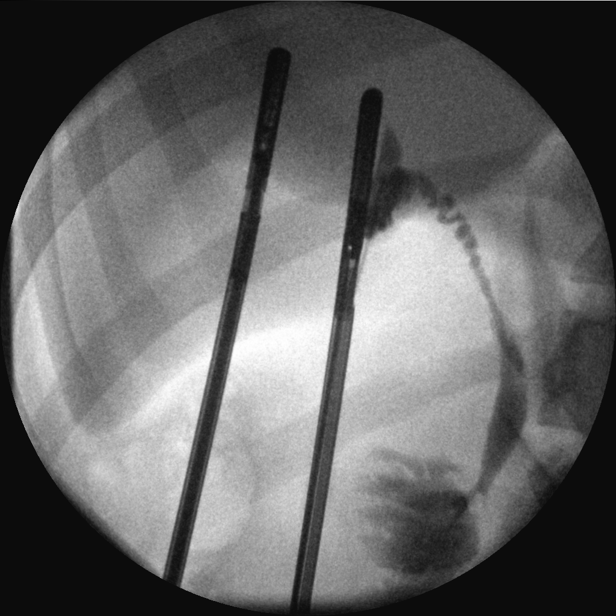
[frame 26/116]
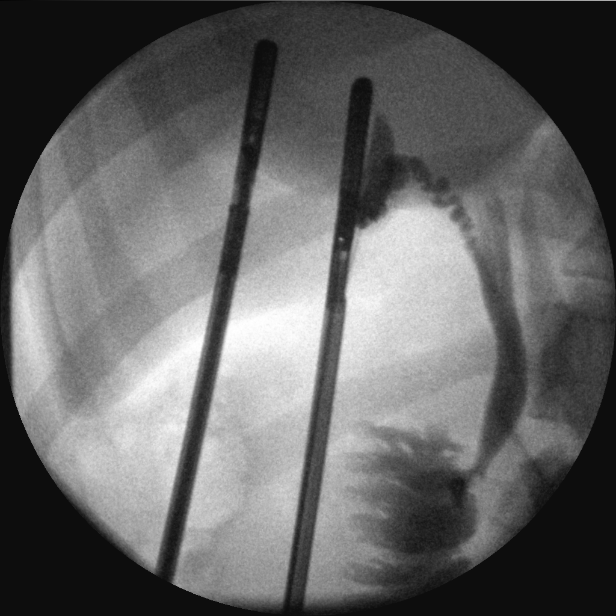
[frame 52/116]
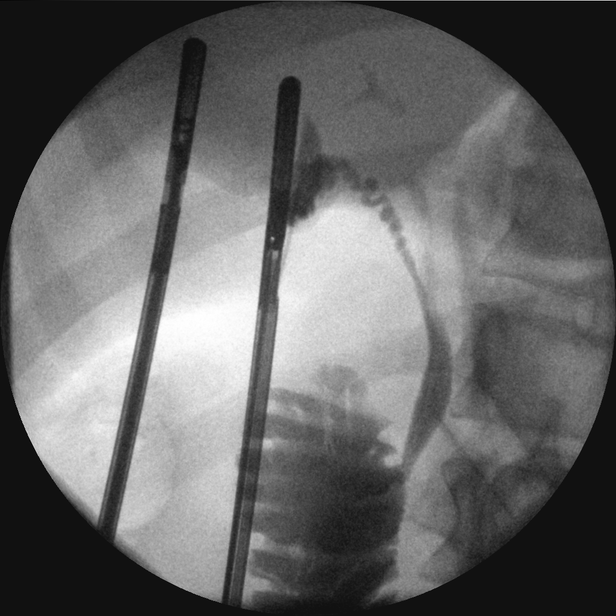
[frame 64/116]
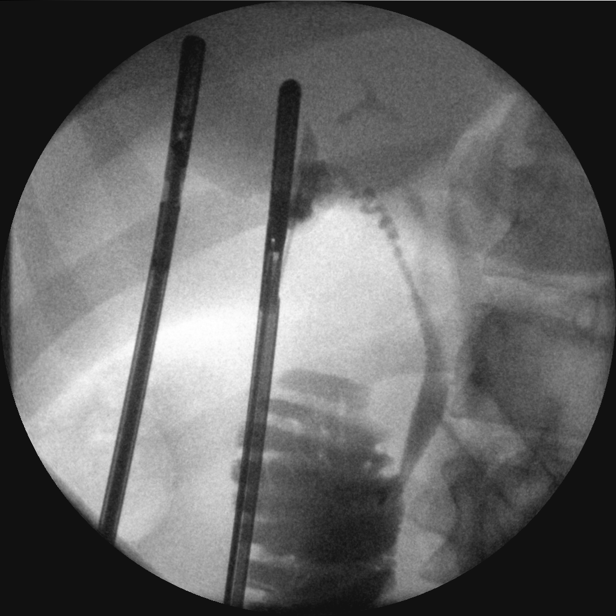
[frame 77/116]
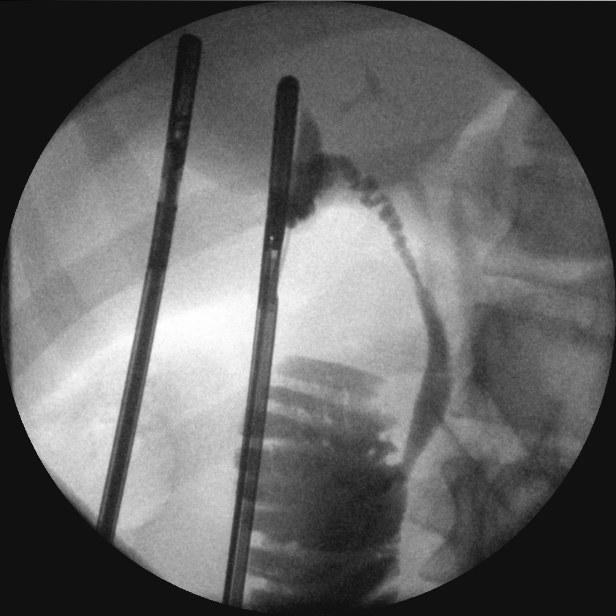
[frame 103/116]
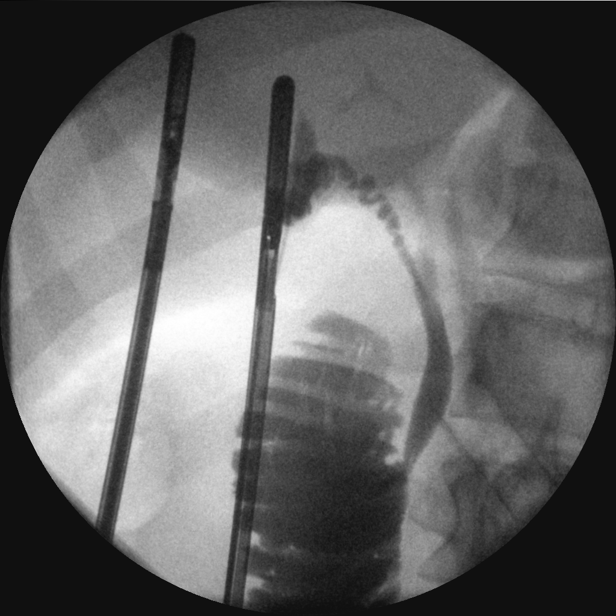
[frame 116/116]
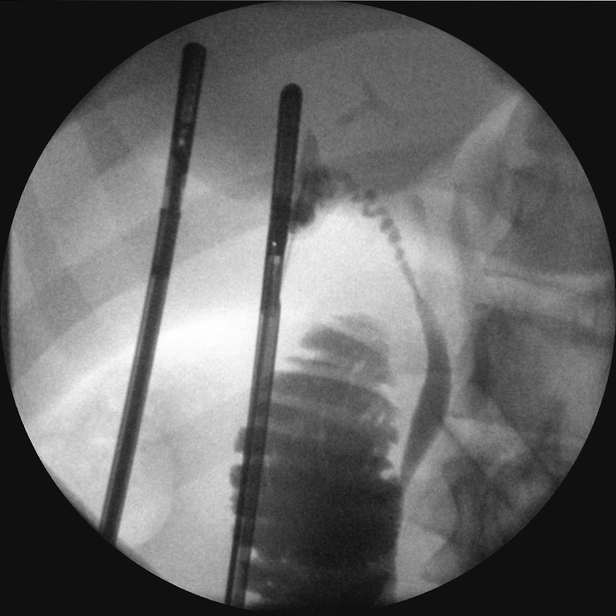

[15 of 20 positions shown; findings below may reference images not displayed]

FINDINGS: FINDINGS
Contrast fills the cystic duct, common bile duct and common hepatic
duct. Contrast drains into the duodenum. No obstructing stones.
IMPRESSION: Patent biliary system.  Normal intraoperative cholangiogram.

## 2017-10-24 IMAGING — US US ABDOMEN LIMITED
1 series · 13 of 25 positions shown · non-contrast
Comparison: None available.

CLINICAL DATA: Initial evaluation for acute right upper quadrant
and epigastric pain for 4 hours. Elevated LFTs with elevated lipase.

EXAM:
US ABDOMEN LIMITED - RIGHT UPPER QUADRANT

[Series 1: us abdomen limited · 0.20mm/px · 13 of 49 slices shown]
[im 1/49]
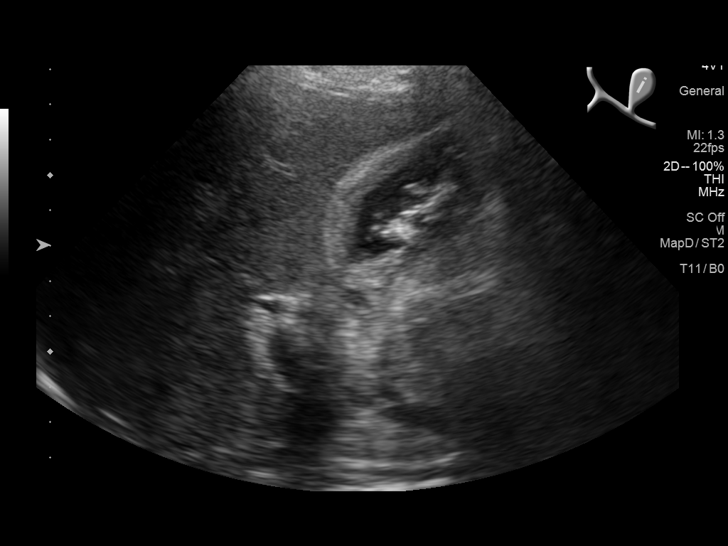
[im 5/49]
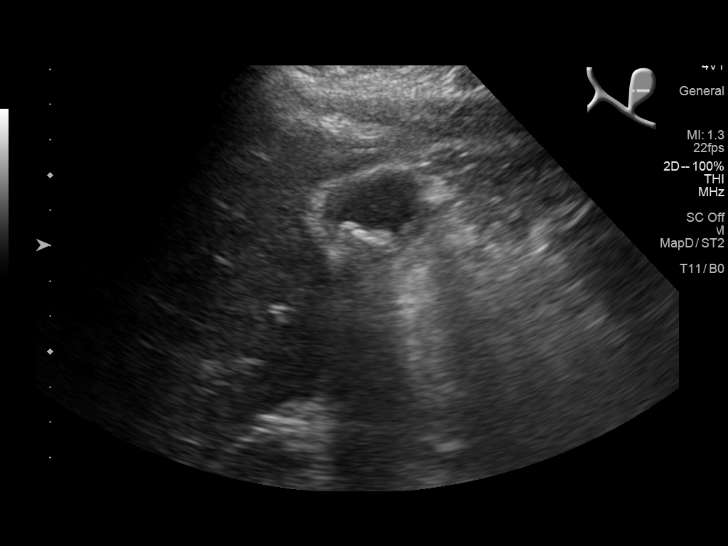
[im 9/49]
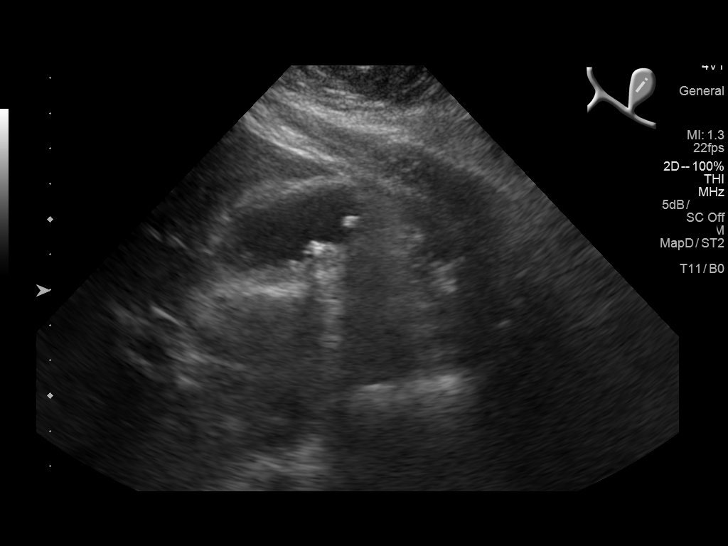
[im 13/49]
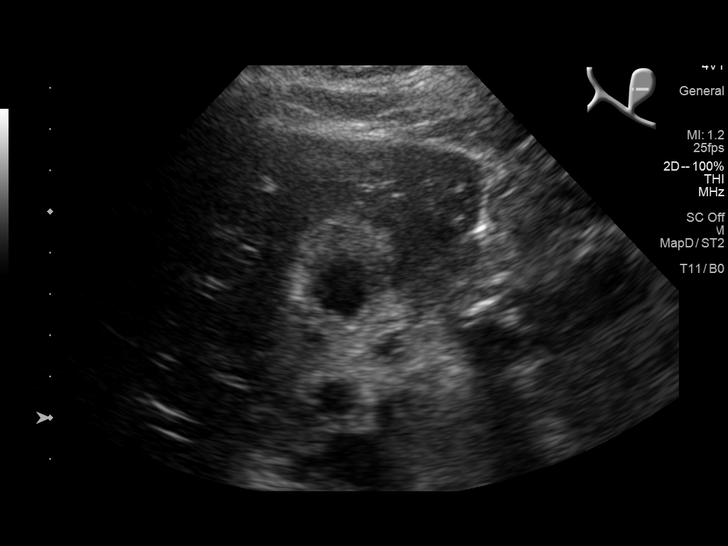
[im 17/49]
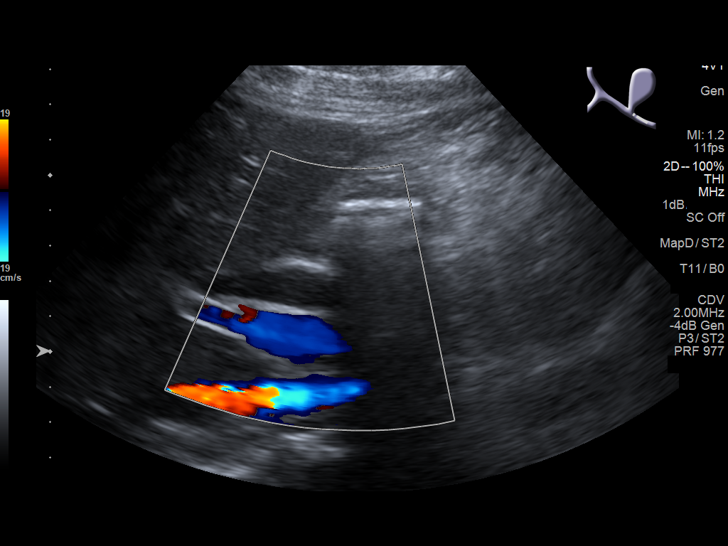
[im 21/49]
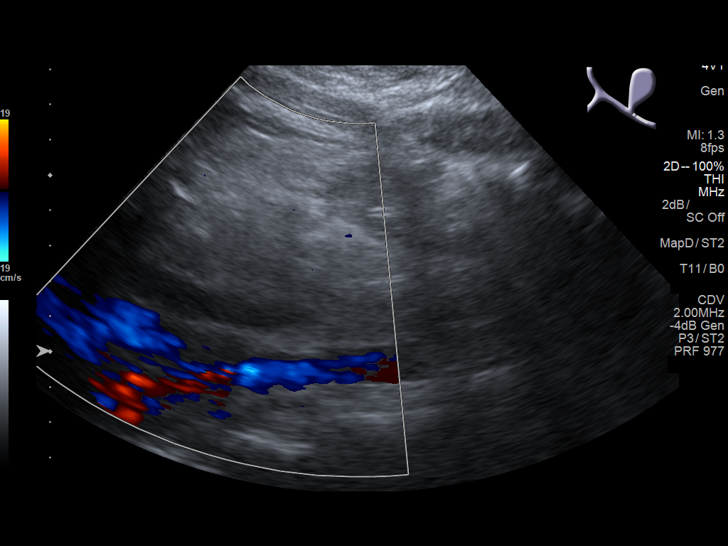
[im 25/49]
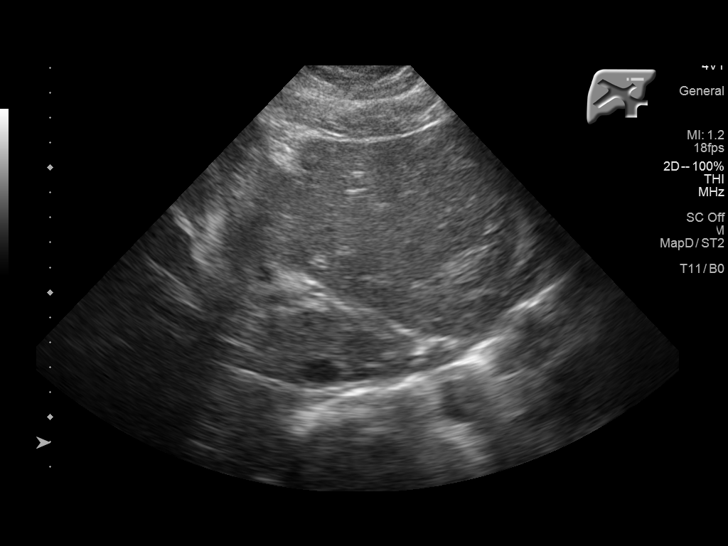
[im 29/49]
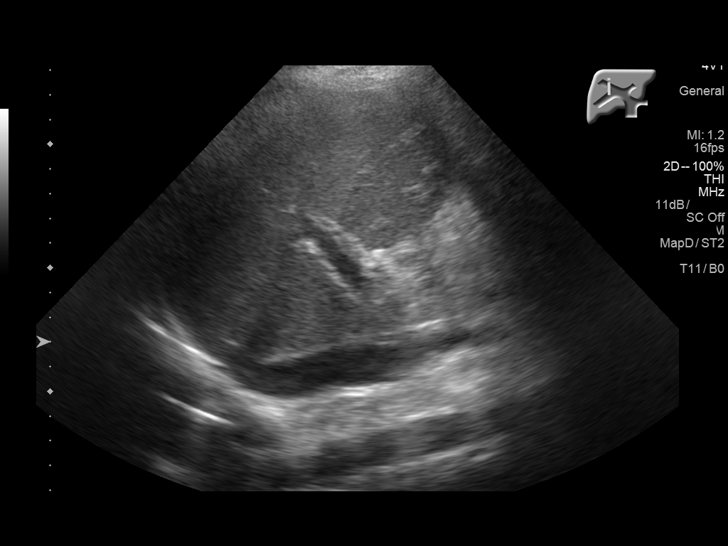
[im 33/49]
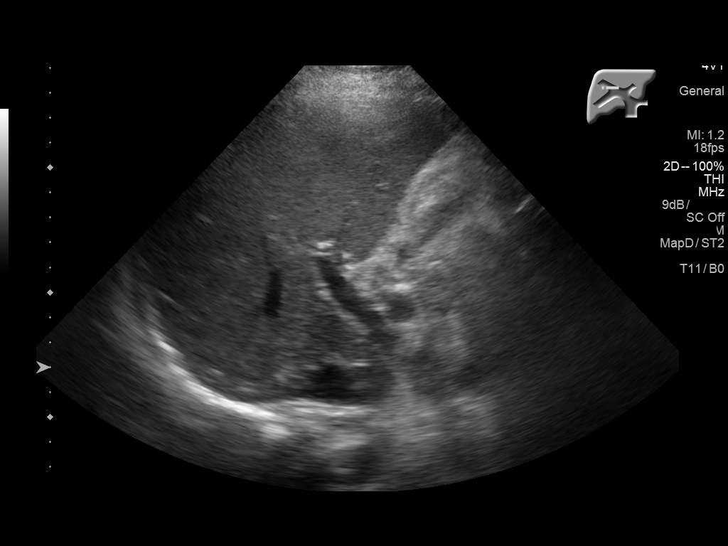
[im 37/49]
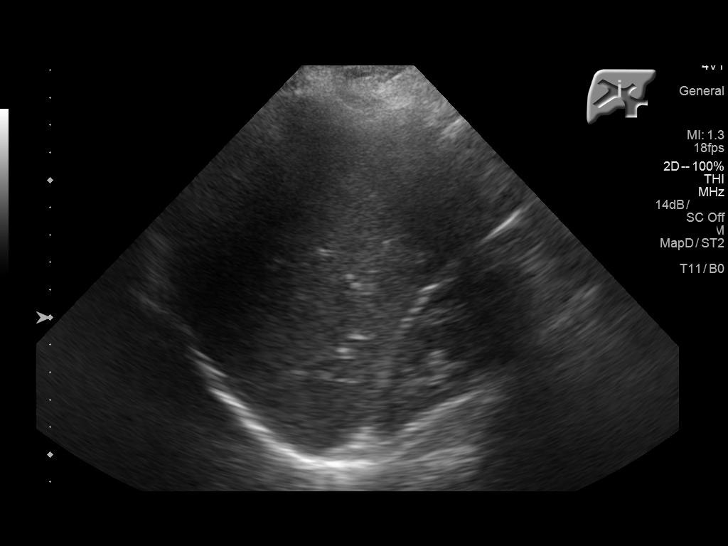
[im 41/49]
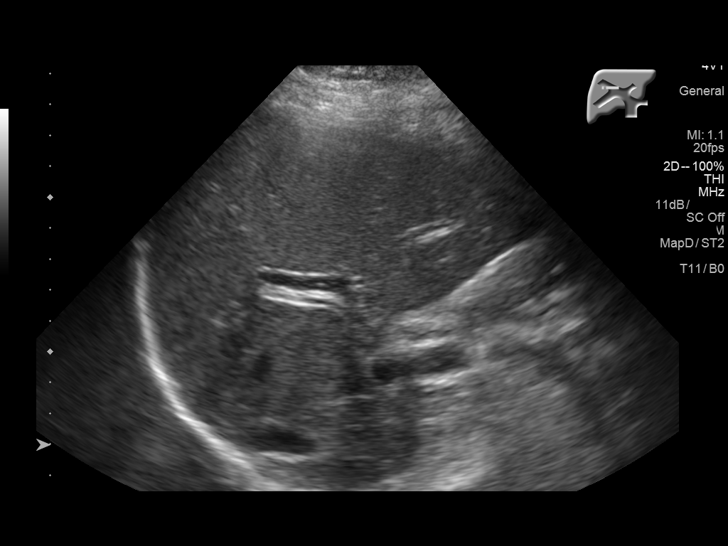
[im 45/49]
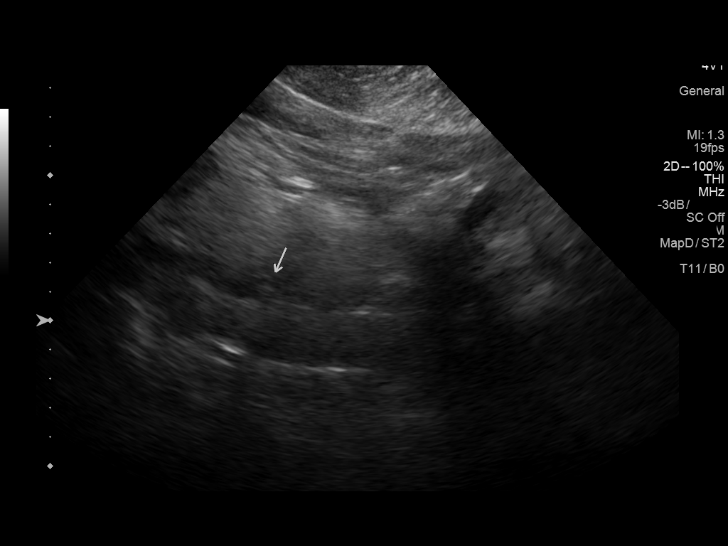
[im 49/49]
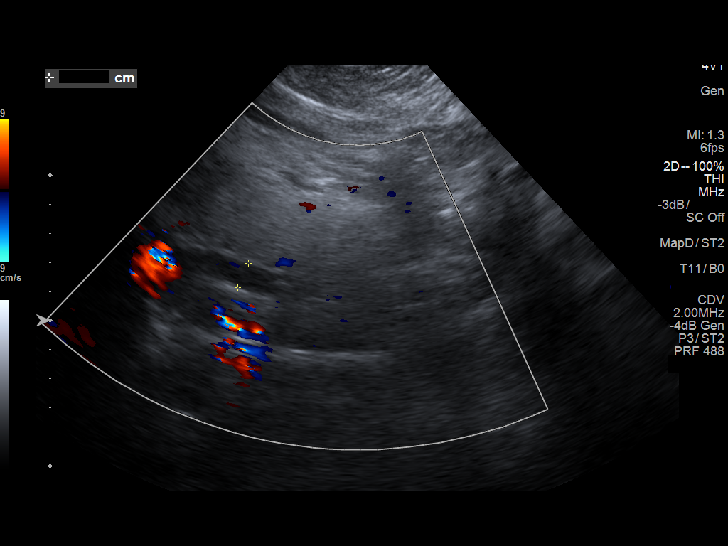

[13 of 25 positions shown; findings below may reference images not displayed]

FINDINGS: Gallbladder:

Multiple shadowing echogenic stones present within the gallbladder
lumen, largest of which measured approximately 4.7 mm. Associated
gallbladder sludge present. Gallbladder wall was thickened up to 7
mm. No frank pericholecystic free fluid. Unable to assess for
sonographic Murphy sign as the patient was medicated.

Common bile duct:

Diameter: 10 mm. Possible shadowing echogenic stone measuring
approximately 6 mm noted within the distal common bile duct,
suggestive of choledocholithiasis.

Liver:

No focal lesion identified. Within normal limits in parenchymal
echogenicity. Mild intrahepatic biliary dilatation.
IMPRESSION: 1. Cholelithiasis and sludge within the gallbladder lumen with
associated gallbladder wall thickening. Given the provided history,
findings are concerning for possible acute cholecystitis.
2. Dilatation of the common bile duct up to 10 mm. 6 mm echogenic
focus within the distal common bile duct suggestive of an
obstructive stone.
